# Patient Record
Sex: Female | Born: 1990 | Hispanic: Yes | Marital: Married | Smoking: Never smoker
Health system: Southern US, Community
[De-identification: ages and names within clinical notes are randomized; demographics above are authoritative.]

## PROBLEM LIST (undated history)

## (undated) DIAGNOSIS — O24419 Gestational diabetes mellitus in pregnancy, unspecified control: Secondary | ICD-10-CM

## (undated) DIAGNOSIS — B009 Herpesviral infection, unspecified: Secondary | ICD-10-CM

## (undated) DIAGNOSIS — I8390 Asymptomatic varicose veins of unspecified lower extremity: Secondary | ICD-10-CM

## (undated) DIAGNOSIS — Z6710 Type A blood, Rh positive: Secondary | ICD-10-CM

## (undated) DIAGNOSIS — K0889 Other specified disorders of teeth and supporting structures: Secondary | ICD-10-CM

## (undated) DIAGNOSIS — D649 Anemia, unspecified: Secondary | ICD-10-CM

## (undated) DIAGNOSIS — R12 Heartburn: Secondary | ICD-10-CM

## (undated) HISTORY — DX: Gestational diabetes mellitus in pregnancy, unspecified control: O24.419

## (undated) HISTORY — DX: Anemia, unspecified: D64.9

## (undated) HISTORY — PX: NO PAST SURGERIES: SHX2092

## (undated) HISTORY — PX: OTHER SURGICAL HISTORY: SHX169

## (undated) HISTORY — DX: Heartburn: R12

## (undated) HISTORY — DX: Asymptomatic varicose veins of unspecified lower extremity: I83.90

## (undated) HISTORY — DX: Other specified disorders of teeth and supporting structures: K08.89

---
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
## 2012-09-11 LAB — OB RESULTS CONSOLE VARICELLA ZOSTER ANTIBODY, IGG: Varicella: IMMUNE

## 2012-09-11 LAB — OB RESULTS CONSOLE RUBELLA ANTIBODY, IGM: Rubella: IMMUNE

## 2012-11-27 ENCOUNTER — Ambulatory Visit: Payer: Self-pay | Admitting: Family Medicine

## 2013-02-19 ENCOUNTER — Ambulatory Visit: Payer: Self-pay | Admitting: Advanced Practice Midwife

## 2013-02-25 ENCOUNTER — Ambulatory Visit: Payer: Self-pay | Admitting: Advanced Practice Midwife

## 2013-03-20 ENCOUNTER — Ambulatory Visit: Payer: Self-pay | Admitting: Advanced Practice Midwife

## 2013-03-28 ENCOUNTER — Ambulatory Visit: Payer: Self-pay | Admitting: Advanced Practice Midwife

## 2013-04-19 ENCOUNTER — Inpatient Hospital Stay: Payer: Self-pay | Admitting: Obstetrics and Gynecology

## 2013-04-19 LAB — CBC WITH DIFFERENTIAL/PLATELET
Basophil #: 0.1 10*3/uL (ref 0.0–0.1)
Basophil %: 0.5 %
Eosinophil %: 0.3 %
HGB: 13.9 g/dL (ref 12.0–16.0)
Lymphocyte #: 2.3 10*3/uL (ref 1.0–3.6)
MCH: 27.3 pg (ref 26.0–34.0)
MCHC: 34.4 g/dL (ref 32.0–36.0)
MCV: 80 fL (ref 80–100)
Monocyte #: 0.6 x10 3/mm (ref 0.2–0.9)
Neutrophil #: 8.8 10*3/uL — ABNORMAL HIGH (ref 1.4–6.5)
Neutrophil %: 74 %
Platelet: 253 10*3/uL (ref 150–440)
RDW: 18.3 % — ABNORMAL HIGH (ref 11.5–14.5)
WBC: 11.9 10*3/uL — ABNORMAL HIGH (ref 3.6–11.0)

## 2013-04-19 LAB — GC/CHLAMYDIA PROBE AMP

## 2013-04-21 LAB — HEMOGLOBIN: HGB: 9.4 g/dL — ABNORMAL LOW (ref 12.0–16.0)

## 2014-08-28 NOTE — L&D Delivery Note (Signed)
Delivery Note At 8:31 PM a viable female sex was delivered via Vaginal, Spontaneous Delivery (Presentation: ; Occiput Anterior).  APGAR: 8,9 ; weight 3265g .   Placenta status: Intact, Spontaneous.  Cord:  with the following complications: .  Cord pH: no needed  Anesthesia: None  Episiotomy:   Lacerations: 1st degree Suture Repair: 2.0 vicryl Est. Blood Loss (mL):  700 cc. Uterine atony noted immediately after placenta removed despite active third stage management. Pitocin started, 0.2 mg of methergine given IM, vigorous uterine massage led to adequate tone. 800 mcg of misoprostol given PR for ppx.   Mom to postpartum.  Baby to Couplet care / Skin to Skin.  GOODMAN, DAVID MICHAEL 02/27/2015, 8:50 PM

## 2014-09-01 ENCOUNTER — Ambulatory Visit: Payer: Self-pay | Admitting: Family Medicine

## 2014-09-25 ENCOUNTER — Ambulatory Visit: Payer: Self-pay | Admitting: Advanced Practice Midwife

## 2014-12-08 ENCOUNTER — Ambulatory Visit
Admit: 2014-12-08 | Disposition: A | Discharge status: Home / Self Care | Payer: Self-pay | Attending: Family Medicine | Admitting: Family Medicine

## 2014-12-18 NOTE — Discharge Summary (Signed)
Dates of Admission and Diagnosis:  Date of Admission 19-Apr-2013   Date of Discharge 22-Apr-2013   Admitting Diagnosis labor   Final Diagnosis delivery    Chief Complaint/History of Present Illness labor at term   Hospital Course:  Hospital Course SVD without trouble   Condition on Discharge Critical   DISCHARGE INSTRUCTIONS HOME MEDS:  Medication Reconciliation: Patient's Home Medications at Discharge:     Electronic Signatures: Margaretha GlassingEvans, [name] L (MD)  (Signed 26-Aug-14 09:37)  Authored: ADMISSION DATE AND DIAGNOSIS, CHIEF COMPLAINT/HPI, HOSPITAL COURSE, DISCHARGE INSTRUCTIONS HOME MEDS   Last Updated: 26-Aug-14 09:37 by Margaretha GlassingEvans, [name] L (MD)

## 2015-01-05 NOTE — H&P (Signed)
L&D Evaluation:  History:  [facility] 24 [facility]   Presents with contractions   Patient's Medical History No Chronic Illness  +GDM   Patient's Surgical History none   Medications Pre Natal Vitamins   Allergies NKDA   ROS:  ROS All systems were reviewed.  HEENT, CNS, GI, GU, Respiratory, CV, Renal and Musculoskeletal systems were found to be normal.   Exam:  Vital Signs stable   General no apparent distress   Abdomen gravid, tender with contractions   Estimated Fetal Weight Average for gestational age   Edema 1+   Pelvic 1->4 cm   Mebranes SROM 04:30 8/24...clear   FHT normal rate with no decels   Ucx regular   Ucx Frequency 3 min   Impression:  Impression early labor   Plan:  Plan monitor contractions and for cervical change   Comments Blood sugar wnl IV stadol for pain mgt per pt request   Electronic Signatures: Margaretha GlassingEvans, [name] L (MD)  (Signed 24-Aug-14 04:27)  Authored: L&D Evaluation   Last Updated: 24-Aug-14 04:27 by Margaretha GlassingEvans, [name] L (MD)

## 2015-02-27 ENCOUNTER — Encounter: Payer: Self-pay | Admitting: *Deleted

## 2015-02-27 ENCOUNTER — Inpatient Hospital Stay
Admission: EM | Admit: 2015-02-27 | Discharge: 2015-03-01 | DRG: 775 | Disposition: A | Discharge status: Home / Self Care | Payer: Medicaid Other | Attending: Obstetrics and Gynecology | Admitting: Obstetrics and Gynecology

## 2015-02-27 DIAGNOSIS — O24420 Gestational diabetes mellitus in childbirth, diet controlled: Secondary | ICD-10-CM | POA: Diagnosis present

## 2015-02-27 DIAGNOSIS — Z3A39 39 weeks gestation of pregnancy: Secondary | ICD-10-CM | POA: Diagnosis present

## 2015-02-27 HISTORY — DX: Herpesviral infection, unspecified: B00.9

## 2015-02-27 LAB — CBC
HEMATOCRIT: 40.7 % (ref 35.0–47.0)
HEMOGLOBIN: 13.1 g/dL (ref 12.0–16.0)
MCH: 24.6 pg — ABNORMAL LOW (ref 26.0–34.0)
MCHC: 32.1 g/dL (ref 32.0–36.0)
MCV: 76.6 fL — AB (ref 80.0–100.0)
PLATELETS: 256 10*3/uL (ref 150–440)
RBC: 5.32 MIL/uL — ABNORMAL HIGH (ref 3.80–5.20)
RDW: 16.1 % — ABNORMAL HIGH (ref 11.5–14.5)
WBC: 12.4 10*3/uL — ABNORMAL HIGH (ref 3.6–11.0)

## 2015-02-27 LAB — TYPE AND SCREEN
ABO/RH(D): A POS
Antibody Screen: NEGATIVE

## 2015-02-27 LAB — ABO/RH: ABO/RH(D): A POS

## 2015-02-27 MED ORDER — LACTATED RINGERS IV SOLN
INTRAVENOUS | Status: DC
  Administered 2015-02-27: 19:00:00 via INTRAVENOUS

## 2015-02-27 MED ORDER — MISOPROSTOL 200 MCG PO TABS
ORAL_TABLET | ORAL | Status: AC
  Filled 2015-02-27: qty 4

## 2015-02-27 MED ORDER — OXYTOCIN 40 UNITS IN LACTATED RINGERS INFUSION - SIMPLE MED
62.5000 mL/h | INTRAVENOUS | Status: DC
Start: 2015-02-27 — End: 2015-02-28

## 2015-02-27 MED ORDER — MORPHINE SULFATE 4 MG/ML IJ SOLN
INTRAMUSCULAR | Status: AC
  Administered 2015-02-27: 21:00:00
  Filled 2015-02-27: qty 1

## 2015-02-27 MED ORDER — IBUPROFEN 600 MG PO TABS
ORAL_TABLET | ORAL | Status: AC
  Administered 2015-02-27: 600 mg via ORAL
  Filled 2015-02-27: qty 1

## 2015-02-27 MED ORDER — MISOPROSTOL 200 MCG PO TABS
ORAL_TABLET | ORAL | Status: AC
  Administered 2015-02-27: 200 ug
  Filled 2015-02-27: qty 1

## 2015-02-27 MED ORDER — LIDOCAINE HCL (PF) 1 % IJ SOLN
INTRAMUSCULAR | Status: AC
  Filled 2015-02-27: qty 30

## 2015-02-27 MED ORDER — METHYLERGONOVINE MALEATE 0.2 MG/ML IJ SOLN
INTRAMUSCULAR | Status: AC
  Administered 2015-02-27: 21:00:00 via INTRAMUSCULAR
  Filled 2015-02-27: qty 1

## 2015-02-27 MED ORDER — LACTATED RINGERS IV SOLN: 500.0000 mL | INTRAVENOUS | Status: DC | PRN

## 2015-02-27 MED ORDER — HYDROCODONE-ACETAMINOPHEN 5-325 MG PO TABS
ORAL_TABLET | ORAL | Status: AC
  Filled 2015-02-27: qty 2

## 2015-02-27 MED ORDER — CITRIC ACID-SODIUM CITRATE 334-500 MG/5ML PO SOLN: 30.0000 mL | ORAL | Status: DC | PRN

## 2015-02-27 MED ORDER — OXYTOCIN BOLUS FROM INFUSION
500.0000 mL | INTRAVENOUS | Status: DC
  Administered 2015-02-27: 500 mL via INTRAVENOUS

## 2015-02-27 MED ORDER — LIDOCAINE HCL (PF) 1 % IJ SOLN
30.0000 mL | INTRAMUSCULAR | Status: DC | PRN
  Filled 2015-02-27: qty 30

## 2015-02-27 MED ORDER — OXYTOCIN 40 UNITS IN LACTATED RINGERS INFUSION - SIMPLE MED
INTRAVENOUS | Status: AC
  Filled 2015-02-27: qty 1000

## 2015-02-27 MED ORDER — OXYCODONE-ACETAMINOPHEN 5-325 MG PO TABS
2.0000 | ORAL_TABLET | ORAL | Status: DC | PRN
  Administered 2015-02-28: 2 via ORAL
  Filled 2015-02-27: qty 2

## 2015-02-27 MED ORDER — IBUPROFEN 600 MG PO TABS
600.0000 mg | ORAL_TABLET | Freq: Four times a day (QID) | ORAL | Status: DC
  Administered 2015-02-27 – 2015-03-01 (×5): 600 mg via ORAL
  Filled 2015-02-27 (×4): qty 1

## 2015-02-27 MED ORDER — ONDANSETRON HCL 4 MG/2ML IJ SOLN: 4.0000 mg | Freq: Four times a day (QID) | INTRAMUSCULAR | Status: DC | PRN

## 2015-02-27 NOTE — H&P (Signed)
HISTORY AND PHYSICAL  HISTORY OF PRESENT ILLNESS: Ms. Kristine Blake is a 24 y.o. G2P1001 at [redacted]w[redacted]d by LMP consistent with 18 week ultrasound with a pregnancy complicated by A1GDM presenting for evaluation of labor.   She has  been having contractions Q5-10 since 1200 and denies leakage of fluid, vaginal bleeding, or decreased fetal movement.   She declined taking glyburide this pregnancy and reports adequate diet control this pregnancy with FSBS <90 in AM.   REVIEW OF SYSTEMS: A complete review of systems was performed and was specifically negative for headache, changes in vision, RUQ pain, shortness of breath, chest pain, lower extremity edema and dysuria.   HISTORY:  Past Medical History  Diagnosis Date  . HSV infection     History reviewed. No pertinent past surgical history.  No current facility-administered medications on file prior to encounter.   No current outpatient prescriptions on file prior to encounter.     Not on File  OB History  Gravida Para Term Preterm AB SAB TAB Ectopic Multiple Living  2 1 1       1     # Outcome Date GA Lbr Len/2nd Weight Sex Delivery Anes PTL Lv  2 Current           1 Term 04/20/13     Vag-Spont   Y      History  Substance Use Topics  . Smoking status: Never Smoker   . Smokeless tobacco: Never Used  . Alcohol Use: No    PHYSICAL EXAM: Pulse Rate:  [92-118] 92 (07/02 1943) Resp:  [22] 22 (07/02 1857) BP: (110-133)/(85-98) 110/85 mmHg (07/02 1943) Weight:  [79.833 kg (176 lb)] 79.833 kg (176 lb) (07/02 1855)  GENERAL: NAD AAOx3 CHEST:CTAB no increased work of breathing CV:RRR no appreciable murmurs, rubs, gallops ABDOMEN: gravid, nontender, EFW 3600g by [place] EXTREMITIES:  Warm and well-perfused, nontender, nonedematous, 2+ DTRs, no clonus CERVIX: 8/ 90/ 0 SPECULUM: no evidence of HSV lesions  FHT:140s baseline with mod variability + accelerations and 0 decelerations  Toco: 2-3  DIAGNOSTIC STUDIES:  Recent Labs Lab  02/27/15 1918  WBC 12.4*  HGB 13.1  HCT 40.7  PLT 256    PRENATAL STUDIES:  Prenatal Labs:  MBT: A+; Rubella immune, Varicella immune, HIV neg, RPR neg, Hep B neg, GC/CT neg, GBS neg, glucola 147 3 hr 74/151/158/150   ASSESSMENT AND PLAN:  1. Fetal Well being  - Fetal Tracing: reassuring - Group B Streptococcus: neg - Presentation: vertex confirmed by myself   2. Routine OB: - Prenatal labs reviewed, as above - Rh +  3. Evaluation of Labor:  -  Contractions present with external toco in place -  Pelvis proven to 3315 g. AROMed clear fluid, mom and baby tolerated well.  -  Plan for management with serial vaginal exams, AROM when appropriate, augmentation as necessary.   4. Post Partum Planning: - Infant feeding: breastfeeding - Contraception: not addressed.

## 2015-02-28 LAB — CBC
HCT: 34.2 % — ABNORMAL LOW (ref 35.0–47.0)
Hemoglobin: 10.9 g/dL — ABNORMAL LOW (ref 12.0–16.0)
MCH: 24.3 pg — AB (ref 26.0–34.0)
MCHC: 31.8 g/dL — AB (ref 32.0–36.0)
MCV: 76.3 fL — ABNORMAL LOW (ref 80.0–100.0)
Platelets: 195 10*3/uL (ref 150–440)
RBC: 4.49 MIL/uL (ref 3.80–5.20)
RDW: 15.8 % — AB (ref 11.5–14.5)
WBC: 18.3 10*3/uL — ABNORMAL HIGH (ref 3.6–11.0)

## 2015-02-28 MED ORDER — WITCH HAZEL-GLYCERIN EX PADS: 1.0000 "application " | MEDICATED_PAD | CUTANEOUS | Status: DC | PRN

## 2015-02-28 MED ORDER — DIPHENHYDRAMINE HCL 25 MG PO CAPS: 25.0000 mg | ORAL_CAPSULE | Freq: Four times a day (QID) | ORAL | Status: DC | PRN

## 2015-02-28 MED ORDER — ZOLPIDEM TARTRATE 5 MG PO TABS: 5.0000 mg | ORAL_TABLET | Freq: Every evening | ORAL | Status: DC | PRN

## 2015-02-28 MED ORDER — OXYCODONE-ACETAMINOPHEN 5-325 MG PO TABS: 1.0000 | ORAL_TABLET | ORAL | Status: DC | PRN

## 2015-02-28 MED ORDER — BENZOCAINE-MENTHOL 20-0.5 % EX AERO: 1.0000 "application " | INHALATION_SPRAY | CUTANEOUS | Status: DC | PRN

## 2015-02-28 MED ORDER — DIBUCAINE 1 % RE OINT: 1.0000 "application " | TOPICAL_OINTMENT | RECTAL | Status: DC | PRN

## 2015-02-28 MED ORDER — TETANUS-DIPHTH-ACELL PERTUSSIS 5-2.5-18.5 LF-MCG/0.5 IM SUSP: 0.5000 mL | Freq: Once | INTRAMUSCULAR | Status: DC

## 2015-02-28 MED ORDER — OXYTOCIN 40 UNITS IN LACTATED RINGERS INFUSION - SIMPLE MED: 125.0000 mL/h | INTRAVENOUS | Status: DC | PRN

## 2015-02-28 MED ORDER — SIMETHICONE 80 MG PO CHEW: 80.0000 mg | CHEWABLE_TABLET | ORAL | Status: DC | PRN

## 2015-02-28 MED ORDER — LANOLIN HYDROUS EX OINT: TOPICAL_OINTMENT | CUTANEOUS | Status: DC | PRN

## 2015-02-28 MED ORDER — ACETAMINOPHEN 325 MG PO TABS: 650.0000 mg | ORAL_TABLET | ORAL | Status: DC | PRN

## 2015-02-28 MED ORDER — SENNOSIDES-DOCUSATE SODIUM 8.6-50 MG PO TABS
2.0000 | ORAL_TABLET | ORAL | Status: DC
  Administered 2015-03-01: 2 via ORAL
  Filled 2015-02-28: qty 2

## 2015-02-28 MED ORDER — ONDANSETRON HCL 4 MG/2ML IJ SOLN: 4.0000 mg | INTRAMUSCULAR | Status: DC | PRN

## 2015-02-28 MED ORDER — ONDANSETRON HCL 4 MG PO TABS: 4.0000 mg | ORAL_TABLET | ORAL | Status: DC | PRN

## 2015-02-28 MED ORDER — PRENATAL MULTIVITAMIN CH
1.0000 | ORAL_TABLET | Freq: Every day | ORAL | Status: DC
  Administered 2015-02-28: 1 via ORAL
  Filled 2015-02-28: qty 1

## 2015-02-28 NOTE — Progress Notes (Signed)
Post Partum Day 1 Subjective: no complaints  Objective: Blood pressure 103/61, pulse 80, temperature 97.7 F (36.5 C), temperature source Oral, resp. rate 18, height 5\' 2"  (1.575 m), weight 79.833 kg (176 lb), SpO2 98 %, unknown if currently breastfeeding.  Physical Exam:  General: alert Lochia: appropriate Uterine Fundus: firm Incision: healing well DVT Evaluation: No evidence of DVT seen on physical exam.   Recent Labs  02/27/15 1918 02/28/15 0425  HGB 13.1 10.9*  HCT 40.7 34.2*    Assessment/Plan: Plan for discharge tomorrow   LOS: 1 day   Sharee PimpleJONES, CARON W 02/28/2015, 10:08 AM

## 2015-03-01 LAB — RPR: RPR Ser Ql: NONREACTIVE

## 2015-03-01 MED ORDER — IBUPROFEN 600 MG PO TABS: 600.0000 mg | ORAL_TABLET | Freq: Four times a day (QID) | ORAL | Status: DC

## 2015-03-01 NOTE — Progress Notes (Signed)
Patient understands all discharge instructions and the need to make follow up appointments. Patient discharge via wheelchair with auxillary. 

## 2015-03-01 NOTE — Progress Notes (Deleted)
Bleeding: Your bleeding could continue up to 6 weeks, the flow should gradually decrease and the color should become dark then lightened over the next couple of weeks. If you notice you are bleeding heavily or passing clots larger than the size of your fist, PLEASE call your physician. No TAMPONS, DOUCHING, ENEMAS OR SEXUAL INTERCOURSE for 6 weeks.  ° °Stitches: Shower daily with mild soap and water. Stitches will dissolve over the next couple of weeks, if you experience any discomfort in the vaginal area you may sit in warm water 15-20 minutes, 3-4 times per day. Just enough water to cover vaginal area.  ° °AfterPains: This is the uterus contracting back to its normal position and size. Use medications prescribed or recommended by your physician to help relieve this discomfort.  ° °Bowels/Hemorrhoids: Drink plenty of water and stay active. Increase fiber, fresh fruits and vegetables in your diet.  ° °Rest/Activity: Rest when the baby is resting ° °Bathing: Shower daily! ° °Diet: Continue to eat extra calories until your follow up visit to help replenish nutrients and vitamins. If breastfeeding eat an extra 500-1000 and increase your fluid intake to 12 glasses a day.  ° °Contraception: Consult with your physician on what method of birth control you would like to use.  ° °Postpartum "BLUES": It is common to emotional days after delivery, however if it persist for greater than 2 weeks or if you feel concerned please let your physician know immediately. This is hormone driven and nothing you can control so please let someone know how you feel. ° °Follow Up Visit: Please schedule a follow up visit with your physician.  °

## 2015-03-01 NOTE — Discharge Summary (Signed)
Obstetric Discharge Summary Reason for Admission: onset of labor Prenatal Procedures: ultrasound Intrapartum Procedures: spontaneous vaginal delivery Postpartum Procedures: none Complications-Operative and Postpartum: none HEMOGLOBIN  Date Value Ref Range Status  02/28/2015 10.9* 12.0 - 16.0 g/dL Final   HGB  Date Value Ref Range Status  04/21/2013 9.4* 12.0-16.0 g/dL Final   HCT  Date Value Ref Range Status  02/28/2015 34.2* 35.0 - 47.0 % Final  04/19/2013 40.4 35.0-47.0 % Final    Physical Exam:  General: alert Lochia: appropriate Uterine Fundus: firm Incision: healing well DVT Evaluation: No evidence of DVT seen on physical exam.  Discharge Diagnoses: Term Pregnancy-delivered  Discharge Information: Date: 03/01/2015 Activity: unrestricted Diet: routine Medications: Iron Condition: stable Instructions: refer to practice specific booklet and fu at 6 weeks Discharge to: home   Newborn Data: Live born female  Birth Weight: 7 lb 3.2 oz (3266 g) APGAR: 8, 9  Home with mother.  Milon ScoreJONES, CARON W 03/01/2015, 8:22 AM

## 2015-06-23 ENCOUNTER — Emergency Department
Admission: EM | Admit: 2015-06-23 | Discharge: 2015-06-23 | Disposition: A | Discharge status: Home / Self Care | Payer: Self-pay | Attending: Emergency Medicine | Admitting: Emergency Medicine

## 2015-06-23 ENCOUNTER — Encounter: Payer: Self-pay | Admitting: Emergency Medicine

## 2015-06-23 DIAGNOSIS — J029 Acute pharyngitis, unspecified: Secondary | ICD-10-CM | POA: Insufficient documentation

## 2015-06-23 LAB — POCT RAPID STREP A: Streptococcus, Group A Screen (Direct): NEGATIVE

## 2015-06-23 MED ORDER — LIDOCAINE VISCOUS 2 % MT SOLN
15.0000 mL | Freq: Once | OROMUCOSAL | Status: AC
  Administered 2015-06-23: 15 mL via OROMUCOSAL
  Filled 2015-06-23: qty 15

## 2015-06-23 MED ORDER — AMOXICILLIN-POT CLAVULANATE 875-125 MG PO TABS
1.0000 | ORAL_TABLET | Freq: Once | ORAL | Status: AC
  Administered 2015-06-23: 1 via ORAL
  Filled 2015-06-23: qty 1

## 2015-06-23 MED ORDER — AMOXICILLIN-POT CLAVULANATE 875-125 MG PO TABS: 1.0000 | ORAL_TABLET | Freq: Two times a day (BID) | ORAL | Status: AC

## 2015-06-23 NOTE — Discharge Instructions (Signed)
Faringitis  (Pharyngitis)  La faringitis ocurre cuando la faringe presenta enrojecimiento, dolor e hinchazón (inflamación).   CAUSAS   Normalmente, la faringitis se debe a una infección. Generalmente, estas infecciones ocurren debido a virus (viral) y se presentan cuando las personas se resfrían. Sin embargo, a veces la faringitis es provocada por bacterias (bacteriana). Las alergias también pueden ser una causa de la faringitis. La faringitis viral se puede contagiar de una persona a otra al toser, estornudar y compartir objetos o utensilios personales (tazas, tenedores, cucharas, cepillos de diente). La faringitis bacteriana se puede contagiar de una persona a otra a través de un contacto más íntimo, como besar.   SIGNOS Y SÍNTOMAS   Los síntomas de la faringitis incluyen los siguientes:   · Dolor de garganta.  · Cansancio (fatiga).  · Fiebre no muy elevada.  · Dolor de cabeza.  · Dolores musculares y en las articulaciones.  · Erupciones cutáneas  · Ganglios linfáticos hinchados.  · Una película parecida a las placas en la garganta o las amígdalas (frecuente con la faringitis bacteriana).  DIAGNÓSTICO   El médico le hará preguntas sobre la enfermedad y sus síntomas. Normalmente, todo lo que se necesita para diagnosticar una faringitis son sus antecedentes médicos y un examen físico. A veces se realiza una prueba rápida para estreptococos. También es posible que se realicen otros análisis de laboratorio, según la posible causa.   TRATAMIENTO   La faringitis viral normalmente mejorará en un plazo de 3 a 4 días sin medicamentos. La faringitis bacteriana se trata con medicamentos que matan los gérmenes (antibióticos).   INSTRUCCIONES PARA EL CUIDADO EN EL HOGAR   · Beba gran cantidad de líquido para mantener la orina de tono claro o color amarillo pálido.  · Tome solo medicamentos de venta libre o recetados, según las indicaciones del médico.    Si le receta antibióticos, asegúrese de terminarlos, incluso si comienza  a sentirse mejor.    No tome aspirina.  · Descanse lo suficiente.  · Hágase gárgaras con 8 onzas (227 ml) de agua con sal (½ cucharadita de sal por litro de agua) cada 1 o 2 horas para calmar la garganta.  · Puede usar pastillas (si no corre riesgo de ahogarse) o aerosoles para calmar la garganta.  SOLICITE ATENCIÓN MÉDICA SI:   · Tiene bultos grandes y dolorosos en el cuello.  · Tiene una erupción cutánea.  · Cuando tose elimina una expectoración verde, amarillo amarronado o con sangre.  SOLICITE ATENCIÓN MÉDICA DE INMEDIATO SI:   · El cuello se pone rígido.  · Comienza a babear o no puede tragar líquidos.  · Vomita o no puede retener los medicamentos ni los líquidos.  · Siente un dolor intenso que no se alivia con los medicamentos recomendados.  · Tiene dificultades para respirar (y no debido a la nariz tapada).  ASEGÚRESE DE QUE:   · Comprende estas instrucciones.  · Controlará su afección.  · Recibirá ayuda de inmediato si no mejora o si empeora.     Esta información no tiene como fin reemplazar el consejo del médico. Asegúrese de hacerle al médico cualquier pregunta que tenga.     Document Released: 05/24/2005 Document Revised: 06/04/2013  Elsevier Interactive Patient Education ©2016 [employer]

## 2015-06-23 NOTE — ED Notes (Addendum)
Patient ambulatory to triage with steady gait, without difficulty or distress noted; [facility], pt reports sore throat since last night; pt denies any fever or accomp symptoms; st "I feel like I have swords in my stomach"

## 2015-06-23 NOTE — ED Provider Notes (Signed)
[facility] Emergency Department Provider Note  ____________________________________________  Time seen: 4:30 AM  I have reviewed the triage vital signs and the nursing notes.   HISTORY  Chief Complaint Sore Throat     HPI Kristine Blake is a 24 y.o. female presents with sore throat times one day. Patient states feels like him swallowing swords". Patient denies any fever at home no nausea or vomiting or cough. Patient denies any history of pharyngitis.     Past Medical History  Diagnosis Date  . HSV infection     Patient Active Problem List   Diagnosis Date Noted  . Labor and delivery, indication for care 02/27/2015    History reviewed. No pertinent past surgical history.  No current outpatient prescriptions on file.  Allergies Review of patient's allergies indicates no known allergies.  No family history on file.  Social History Social History  Substance Use Topics  . Smoking status: Never Smoker   . Smokeless tobacco: Never Used  . Alcohol Use: No    Review of Systems  Constitutional: Negative for fever. Eyes: Negative for visual changes. ENT: Positive for sore throat. Cardiovascular: Negative for chest pain. Respiratory: Negative for shortness of breath. Gastrointestinal: Negative for abdominal pain, vomiting and diarrhea. Genitourinary: Negative for dysuria. Musculoskeletal: Negative for back pain. Skin: Negative for rash. Neurological: Negative for headaches, focal weakness or numbness.   10-point ROS otherwise negative.  ____________________________________________   PHYSICAL EXAM:  VITAL SIGNS: ED Triage Vitals  Enc Vitals Group     BP 06/23/15 0318 122/94 mmHg     Pulse Rate 06/23/15 0318 87     Resp 06/23/15 0318 20     Temp 06/23/15 0318 97.9 F (36.6 C)     Temp Source 06/23/15 0318 Oral     SpO2 06/23/15 0318 100 %     Weight --      Height --      Head Cir --      Peak Flow --      Pain  Score 06/23/15 0327 8     Pain Loc --      Pain Edu? --      Excl. in GC? --     Constitutional: Alert and oriented. Well appearing and in no distress. Eyes: Conjunctivae are normal. PERRL. Normal extraocular movements. ENT   Head: Normocephalic and atraumatic.   Nose: No congestion/rhinnorhea.   Mouth/Throat: Mucous membranes are moist. Positive pharyngeal erythema with scant exudate   Neck: No stridor Hematological/Lymphatic/Immunilogical: Positive anterior cervical lymphadenopathy. Cardiovascular: Normal rate, regular rhythm. Normal and symmetric distal pulses are present in all extremities. No murmurs, rubs, or gallops. Respiratory: Normal respiratory effort without tachypnea nor retractions. Breath sounds are clear and equal bilaterally. No wheezes/rales/rhonchi. Gastrointestinal: Soft and nontender. No distention. There is no CVA tenderness. Genitourinary: deferred Musculoskeletal: Nontender with normal range of motion in all extremities. No joint effusions.  No lower extremity tenderness nor edema. Neurologic:  Normal speech and language. No gross focal neurologic deficits are appreciated. Speech is normal.  Skin:  Skin is warm, dry and intact. No rash noted. Psychiatric: Mood and affect are normal. Speech and behavior are normal. Patient exhibits appropriate insight and judgment.  _   INITIAL IMPRESSION / ASSESSMENT AND PLAN / ED COURSE  Pertinent labs & imaging results that were available during my care of the patient were reviewed by me and considered in my medical decision making (see chart for details).  History and physical exam consistent  with pharyngitis.  ____________________________________________   FINAL CLINICAL IMPRESSION(S) / ED DIAGNOSES  Final diagnoses:  Acute pharyngitis, unspecified etiology      Darci Currentandolph N Kinjal Neitzke, MD 06/23/15 [phone]

## 2015-06-25 LAB — CULTURE, GROUP A STREP (THRC)

## 2016-08-28 NOTE — L&D Delivery Note (Signed)
Delivery Note At 11:14 AM a viable and healthy female "Diego" was delivered via Vaginal, Spontaneous (Presentation: ROP ).  APGAR: 8, 9; weight 8 lb 2.9 oz (3710 g).   Placenta status: intact, spontaneous.  Cord: 3VC  with the following complications: trailing  Anesthesia:  none Episiotomy: None Lacerations:  Periclitoral, hemostatic Suture Repair: none Est. Blood Loss (mL):  1500  Mom to postpartum.  Baby to Couplet care / Skin to Skin.  16XW [id number] G4P2012 at 38+5wks presented in active labor at 9cm, with a bulging bag. Declined epidural, accepted nitrous. AROM for meconium fluid. Pushed over intact perineum for an OP baby, no shoulder, no nuchal cord. Baby placed on maternal abdomen and active and crying. Delayed cord clamping and FOB cut the cord. Small clitoral tear without bleeding noted.  Her placenta delivered with expression intact but with trailing membranes, and she began to bleed briskly and promptly. She received a bolus of 40u iv pitocin in the 3rd stage, and bimanual massage only helped minimally. Fundus entirely floppy. IV dilaudid given, staff assist code called, and 0.2mg  IM methergine given. 800mcg rectal cytotec given and bimanual extraction of clots. Bleeding continued, and im hemabate given 250mcg. Bleeding slowed. Fundus firm and bleeding minimal at conclusion of case. Evaluation of cervix and vagina and no lacerations noted.   Total quant blood loss: 1650ml  Spanish interpreter present throughout. All questions for mom and FOB answered.  Christeen DouglasBethany Abdalrahman Clementson 08/20/2017, 12:15 PM

## 2016-10-20 ENCOUNTER — Emergency Department
Admission: EM | Admit: 2016-10-20 | Discharge: 2016-10-20 | Disposition: A | Discharge status: Home / Self Care | Payer: Self-pay | Attending: Emergency Medicine | Admitting: Emergency Medicine

## 2016-10-20 ENCOUNTER — Encounter: Payer: Self-pay | Admitting: Emergency Medicine

## 2016-10-20 ENCOUNTER — Emergency Department: Payer: Self-pay

## 2016-10-20 DIAGNOSIS — R109 Unspecified abdominal pain: Secondary | ICD-10-CM

## 2016-10-20 DIAGNOSIS — O039 Complete or unspecified spontaneous abortion without complication: Secondary | ICD-10-CM | POA: Insufficient documentation

## 2016-10-20 DIAGNOSIS — R102 Pelvic and perineal pain: Secondary | ICD-10-CM | POA: Insufficient documentation

## 2016-10-20 DIAGNOSIS — N939 Abnormal uterine and vaginal bleeding, unspecified: Secondary | ICD-10-CM

## 2016-10-20 LAB — BASIC METABOLIC PANEL
Anion gap: 6 (ref 5–15)
BUN: 9 mg/dL (ref 6–20)
CALCIUM: 9 mg/dL (ref 8.9–10.3)
CO2: 25 mmol/L (ref 22–32)
CREATININE: 0.53 mg/dL (ref 0.44–1.00)
Chloride: 108 mmol/L (ref 101–111)
GFR calc Af Amer: >60 mL/min (ref >60)
GLUCOSE: 96 mg/dL (ref 65–99)
Potassium: 3.8 mmol/L (ref 3.5–5.1)
Sodium: 139 mmol/L (ref 135–145)

## 2016-10-20 LAB — URINALYSIS, COMPLETE (UACMP) WITH MICROSCOPIC
Bacteria, UA: NONE SEEN
Bilirubin Urine: NEGATIVE
GLUCOSE, UA: NEGATIVE mg/dL
HGB URINE DIPSTICK: NEGATIVE
Ketones, ur: NEGATIVE mg/dL
Leukocytes, UA: NEGATIVE
Nitrite: NEGATIVE
Protein, ur: NEGATIVE mg/dL
RBC / HPF: NONE SEEN RBC/hpf (ref 0–5)
Specific Gravity, Urine: 1.002 — ABNORMAL LOW (ref 1.005–1.030)
Squamous Epithelial / LPF: NONE SEEN
WBC, UA: NONE SEEN WBC/hpf (ref 0–5)
pH: 6 (ref 5.0–8.0)

## 2016-10-20 LAB — CBC WITH DIFFERENTIAL/PLATELET
Basophils Absolute: 0 10*3/uL (ref 0–0.1)
Basophils Relative: 0 %
Eosinophils Absolute: 0.2 10*3/uL (ref 0–0.7)
Eosinophils Relative: 3 %
HCT: 38.9 % (ref 35.0–47.0)
Hemoglobin: 13.5 g/dL (ref 12.0–16.0)
LYMPHS ABS: 3.7 10*3/uL — AB (ref 1.0–3.6)
LYMPHS PCT: 39 %
MCH: 28.7 pg (ref 26.0–34.0)
MCHC: 34.6 g/dL (ref 32.0–36.0)
MCV: 82.9 fL (ref 80.0–100.0)
MONO ABS: 0.7 10*3/uL (ref 0.2–0.9)
MONOS PCT: 7 %
Neutro Abs: 5 10*3/uL (ref 1.4–6.5)
Neutrophils Relative %: 51 %
PLATELETS: 248 10*3/uL (ref 150–440)
RBC: 4.69 MIL/uL (ref 3.80–5.20)
RDW: 13.6 % (ref 11.5–14.5)
WBC: 9.7 10*3/uL (ref 3.6–11.0)

## 2016-10-20 LAB — HCG, QUANTITATIVE, PREGNANCY: hCG, Beta Chain, Quant, S: 235 m[IU]/mL — ABNORMAL HIGH (ref <5)

## 2016-10-20 LAB — ABO/RH: ABO/RH(D): A POS

## 2016-10-20 LAB — POCT PREGNANCY, URINE
Preg Test, Ur: NEGATIVE
Preg Test, Ur: POSITIVE — AB

## 2016-10-20 MED ORDER — METHYLERGONOVINE MALEATE 0.2 MG PO TABS
0.2000 mg | ORAL_TABLET | Freq: Once | ORAL | Status: AC
  Administered 2016-10-20: 0.2 mg via ORAL
  Filled 2016-10-20: qty 1

## 2016-10-20 MED ORDER — IBUPROFEN 600 MG PO TABS: 600.0000 mg | ORAL_TABLET | Freq: Four times a day (QID) | ORAL | 0 refills | Status: DC | PRN

## 2016-10-20 MED ORDER — ACETAMINOPHEN 325 MG PO TABS
650.0000 mg | ORAL_TABLET | Freq: Once | ORAL | Status: AC
  Administered 2016-10-20: 650 mg via ORAL
  Filled 2016-10-20: qty 2

## 2016-10-20 NOTE — ED Provider Notes (Addendum)
[facility] Emergency Department Provider Note  ____________________________________________   I have reviewed the triage vital signs and the nursing notes.   HISTORY  Chief Complaint Abdominal Pain    HPI Kristine Blake is a 26 y.o. female who is G3 P2. States that she has normal periods every 28 days. States that she has not had a period since November 18 which makes her about 67 and [redacted] weeks pregnant, she is pregnant. Patient states that she had some very faint dark spotting today. Also yesterday. Mild cramping. Denies any significant abdominal pain. Denies any lateralizing pain. Pain is in the midline of the suprapubic region. She is not having pain at this moment. It comes and goes. Scant bleeding.      Past Medical History:  Diagnosis Date  . HSV infection     Patient Active Problem List   Diagnosis Date Noted  . Labor and delivery, indication for care 02/27/2015    History reviewed. No pertinent surgical history.  Prior to Admission medications   Not on File    Allergies Patient has no known allergies.  History reviewed. No pertinent family history.  Social History Social History  Substance Use Topics  . Smoking status: Never Smoker  . Smokeless tobacco: Never Used  . Alcohol use No    Review of Systems Constitutional: No fever/chills Eyes: No visual changes. ENT: No sore throat. No stiff neck no neck pain Cardiovascular: Denies chest pain. Respiratory: Denies shortness of breath. Gastrointestinal:   no vomiting.  No diarrhea.  No constipation. Genitourinary: Negative for dysuria. Musculoskeletal: Negative lower extremity swelling Skin: Negative for rash. Neurological: Negative for severe headaches, focal weakness or numbness. 10-point ROS otherwise negative.  ____________________________________________   PHYSICAL EXAM:  VITAL SIGNS: ED Triage Vitals  Enc Vitals Group     BP 10/20/16 0411 125/84     Pulse  Rate 10/20/16 0411 79     Resp 10/20/16 0411 16     Temp 10/20/16 0411 98.9 F (37.2 C)     Temp Source 10/20/16 0411 Oral     SpO2 10/20/16 0411 100 %     Weight 10/20/16 0408 145 lb (65.8 kg)     Height 10/20/16 0408 5' 2.21" (1.58 m)     Head Circumference --      Peak Flow --      Pain Score 10/20/16 0409 3     Pain Loc --      Pain Edu? --      Excl. in GC? --     Constitutional: Alert and oriented. Well appearing and in no acute distress. Eyes: Conjunctivae are normal. PERRL. EOMI. Head: Atraumatic. Nose: No congestion/rhinnorhea. Mouth/Throat: Mucous membranes are moist.  Oropharynx non-erythematous. Neck: No stridor.   Nontender with no meningismus Cardiovascular: Normal rate, regular rhythm. Grossly normal heart sounds.  Good peripheral circulation. Respiratory: Normal respiratory effort.  No retractions. Lungs CTAB. Abdominal: Soft and nontender. No distention. No guarding no rebound Back:  There is no focal tenderness or step off.  there is no midline tenderness there are no lesions noted. there is no CVA tenderness Musculoskeletal: No lower extremity tenderness, no upper extremity tenderness. No joint effusions, no DVT signs strong distal pulses no edema Neurologic:  Normal speech and language. No gross focal neurologic deficits are appreciated.  Skin:  Skin is warm, dry and intact. No rash noted. Psychiatric: Mood and affect are normal. Speech and behavior are normal.  ____________________________________________   LABS (all labs  ordered are listed, but only abnormal results are displayed)  Labs Reviewed  URINALYSIS, COMPLETE (UACMP) WITH MICROSCOPIC  CBC WITH DIFFERENTIAL/PLATELET  BASIC METABOLIC PANEL  HCG, QUANTITATIVE, PREGNANCY  POC URINE PREG, ED  POCT PREGNANCY, URINE  ABO/RH   ____________________________________________  EKG  I personally interpreted any EKGs ordered by me or  triage  ____________________________________________  RADIOLOGY  I reviewed any imaging ordered by me or triage that were performed during my shift and, if possible, patient and/or family made aware of any abnormal findings. ____________________________________________   PROCEDURES  Procedure(s) performed: None  Procedures  Critical Care performed: None  ____________________________________________   INITIAL IMPRESSION / ASSESSMENT AND PLAN / ED COURSE  Pertinent labs & imaging results that were available during my care of the patient were reviewed by me and considered in my medical decision making (see chart for details).  Patient's presents as a possible bleeding producing over her urine pregnancy is negative. We will check Quant to ensure that she is not pregnant. If that is negative we'll reassess patient. It is in her opinion unusual to miss menstrual cycles, however at this time she appears not to be pregnant.  ----------------------------------------- 5:56 AM on 10/20/2016 ----------------------------------------- Though her pregnancy test here came back as negative , I went ahead and evaluated her as a pregnancy given that she had had 2 positive tests at home.  Thus, she is getting and US. Her quant then came back at 235. I asked the nurse then to recheck the initial pregnancy test, and she reports that it had turned faintly positive. We are evaluating that batch of pregnancy tests and charge nurse Leandro ReasonerRoxanne is aware. Pt rh positive.  This is likely a ab.  ----------------------------------------- 7:45 AM on 10/20/2016 -----------------------------------------  Pelvic exam: Female nurse chaperone present, no external lesions noted, physiologic vaginal discharge noted with no purulent discharge, no cervical motion tenderness, no adnexal tenderness or mass, there is mild uterine tenderness or mass. Significant vaginal bleeding   Discussed with Dr. Tiburcio PeaHarris, patient having a  miscarriage which is clearly an inevitable and likely was missed. Dr. Tiburcio PeaHarris that I give the patient Mepergan and follow her for the next hour or 2 to see what her bleeding does. If that does not do, Dr. Scotty CourtStafford will call Dr. Chauncey CruelStabler. Signed out at the end of my shift.     ____________________________________________   FINAL CLINICAL IMPRESSION(S) / ED DIAGNOSES  Final diagnoses:  None      This chart was dictated using voice recognition software.  Despite best efforts to proofread,  errors can occur which can change meaning.      Jeanmarie PlantJames A Jaiah Weigel, MD 10/20/16 0451    Jeanmarie PlantJames A Alexsis Kathman, MD 10/20/16 [phone]    Jeanmarie PlantJames A Cristoval Teall, MD 10/20/16 [id number]    Jeanmarie PlantJames A Nicolus Ose, MD 10/20/16 [phone]

## 2016-10-20 NOTE — ED Provider Notes (Signed)
 -----------------------------------------   10:34 AM on 10/20/2016 -----------------------------------------  Patient reassessed with Spanish interpreter auto at bedside. Patient reports that after the Methergine she feels that the bleeding has improved but is still continuing. She denies any pain or other new symptoms.  On repeat speculum exam, there is a large amount of clotted blood in the vault. After this was cleared, there is still active fresh bleeding visible, moderately heavy.  [facility] page for further evaluation, consideration of D&C.  ----------------------------------------- 3:18 PM on 10/20/2016 -----------------------------------------  Case discussed with Dr. Chauncey CruelStabler after his assessment. Actually when he went to reevaluate the patient for her decision on Cytotec versus D&C, he reports that she felt that maybe the pregnancy had passed. On reexamination he found that she had completed the miscarriage and the POCs were in the vagina which he removed with a ring forceps.  Patient was counseled. Ibuprofen. Follow up OB in 2 weeks. Rh+.   Sharman CheekPhillip Ryla Cauthon, MD 10/20/16 [phone]

## 2016-10-20 NOTE — ED Triage Notes (Signed)
Pt ambulatory to triage in NAD.  Per tele-interpreter, pt [redacted] weeks pregnant, report dark brown vaginal discharge, reports intermittent abd pain.  Pt reports G3P2.  Pt denies any hx complications with pregnancy.

## 2016-10-20 NOTE — H&P (Addendum)
Obstetrics & Gynecology Consult H&P    Consulting Department: Emergency Department  Consulting Physician: Schuyler Amor, MD  Consulting Question: Missed abortion   History of Present Illness: Patient is a 26 y.o. [id number] presenting with vaginal bleeding in setting of known pregnancy.  ER evaluation included an ultrasound showing a non-viable IUO, CRL of [redacted]w[redacted]d with an elongated gestational sac extending to the cervix.  She was given methergine in the ED has still has some bleeding and continued cramping.  Bleeding has been heavy and contained clots with associated cramping.  Her previous two pregnancies were uncomplicated.  She has not had any trauma or other precipitating events this pregnancy.  This is a desired pregnancy.    Review of Systems:10 point review of systems  Past Medical History:  Past Medical History:  Diagnosis Date  . HSV infection     Past Surgical History:  History reviewed. No pertinent surgical history.  Gynecologic History: No history of STI or abnormal paps  Obstetric History: [id number] Family History:  History reviewed. No pertinent family history.  Social History:  Social History   Social History  . Marital status: Single    Spouse name: N/A  . Number of children: N/A  . Years of education: N/A   Occupational History  . Not on file.   Social History Main Topics  . Smoking status: Never Smoker  . Smokeless tobacco: Never Used  . Alcohol use No  . Drug use: No  . Sexual activity: Yes   Other Topics Concern  . Not on file   Social History Narrative  . No narrative on file    Allergies:  No Known Allergies  Medications: Prior to Admission medications   Not on File    Physical Exam Vitals: Blood pressure 107/72, pulse 83, temperature 98.9 F (37.2 C), temperature source Oral, resp. rate (!) 21, height 5' 2.21" (1.58 m), weight 145 lb (65.8 kg), last menstrual period 07/15/2016, SpO2 99 %, unknown if currently  breastfeeding. General: NAD HEENT: normocephalic, anicteric Pulmonary: No increased work of breathing Cardiovascular: RRR, distal pulses 2+ Abdomen: NABS, soft, non-tender, non-distended Extremities: no edema, erythema, or tenderness Neurologic: Grossly intact Psychiatric: mood appropriate, affect full  Labs: Results for orders placed or performed during the hospital encounter of 10/20/16 (from the past 72 hour(s))  Urinalysis, Complete w Microscopic     Status: Abnormal   Collection Time: 10/20/16  4:31 AM  Result Value Ref Range   Color, Urine COLORLESS (A) YELLOW   APPearance CLEAR (A) CLEAR   Specific Gravity, Urine 1.002 (L) 1.005 - 1.030   pH 6.0 5.0 - 8.0   Glucose, UA NEGATIVE NEGATIVE mg/dL   Hgb urine dipstick NEGATIVE NEGATIVE   Bilirubin Urine NEGATIVE NEGATIVE   Ketones, ur NEGATIVE NEGATIVE mg/dL   Protein, ur NEGATIVE NEGATIVE mg/dL   Nitrite NEGATIVE NEGATIVE   Leukocytes, UA NEGATIVE NEGATIVE   RBC / HPF NONE SEEN 0 - 5 RBC/hpf   WBC, UA NONE SEEN 0 - 5 WBC/hpf   Bacteria, UA NONE SEEN NONE SEEN   Squamous Epithelial / LPF NONE SEEN NONE SEEN  CBC with Differential     Status: Abnormal   Collection Time: 10/20/16  4:31 AM  Result Value Ref Range   WBC 9.7 3.6 - 11.0 K/uL   RBC 4.69 3.80 - 5.20 MIL/uL   Hemoglobin 13.5 12.0 - 16.0 g/dL   HCT 38.9 35.0 - 47.0 %   MCV 82.9 80.0 - 100.0 fL  MCH 28.7 26.0 - 34.0 pg   MCHC 34.6 32.0 - 36.0 g/dL   RDW 13.6 11.5 - 14.5 %   Platelets 248 150 - 440 K/uL   Neutrophils Relative % 51 %   Neutro Abs 5.0 1.4 - 6.5 K/uL   Lymphocytes Relative 39 %   Lymphs Abs 3.7 (H) 1.0 - 3.6 K/uL   Monocytes Relative 7 %   Monocytes Absolute 0.7 0.2 - 0.9 K/uL   Eosinophils Relative 3 %   Eosinophils Absolute 0.2 0 - 0.7 K/uL   Basophils Relative 0 %   Basophils Absolute 0.0 0 - 0.1 K/uL  Basic metabolic panel     Status: None   Collection Time: 10/20/16  4:31 AM  Result Value Ref Range   Sodium 139 135 - 145 mmol/L    Potassium 3.8 3.5 - 5.1 mmol/L   Chloride 108 101 - 111 mmol/L   CO2 25 22 - 32 mmol/L   Glucose, Bld 96 65 - 99 mg/dL   BUN 9 6 - 20 mg/dL   Creatinine, Ser 0.53 0.44 - 1.00 mg/dL   Calcium 9.0 8.9 - 10.3 mg/dL   GFR calc non Af Amer >60 >60 mL/min   GFR calc Af Amer >60 >60 mL/min    Comment: (NOTE) The eGFR has been calculated using the CKD EPI equation. This calculation has not been validated in all clinical situations. eGFR's persistently <60 mL/min signify possible Chronic Kidney Disease.    Anion gap 6 5 - 15  hCG, quantitative, pregnancy     Status: Abnormal   Collection Time: 10/20/16  4:31 AM  Result Value Ref Range   hCG, Beta Chain, Quant, S 235 (H) <5 mIU/mL    Comment:          GEST. AGE      CONC.  (mIU/mL)   <=1 WEEK        5 - 50     2 WEEKS       50 - 500     3 WEEKS       100 - 10,000     4 WEEKS     1,000 - 30,000     5 WEEKS     3,500 - 115,000   6-8 WEEKS     12,000 - 270,000    12 WEEKS     15,000 - 220,000        FEMALE AND NON-PREGNANT FEMALE:     LESS THAN 5 mIU/mL   ABO/Rh     Status: None   Collection Time: 10/20/16  4:31 AM  Result Value Ref Range   ABO/RH(D) A POS   Pregnancy, urine POC     Status: None   Collection Time: 10/20/16  4:40 AM  Result Value Ref Range   Preg Test, Ur NEGATIVE NEGATIVE    Comment:        THE SENSITIVITY OF THIS METHODOLOGY IS >24 mIU/mL   Pregnancy, urine POC     Status: Abnormal   Collection Time: 10/20/16  5:56 AM  Result Value Ref Range   Preg Test, Ur POSITIVE (A) NEGATIVE    Comment:        THE SENSITIVITY OF THIS METHODOLOGY IS >24 mIU/mL     Imaging US Ob Comp Less 14 Wks  Result Date: 10/20/2016 CLINICAL DATA:  Vaginal bleeding. EXAM: OBSTETRIC <14 WK [place] AND TRANSVAGINAL OB US TECHNIQUE: Both transabdominal and transvaginal ultrasound examinations were performed for complete evaluation of the gestation  as well as the maternal uterus, adnexal regions, and pelvic cul-de-sac. Transvaginal technique  was performed to assess early pregnancy. COMPARISON:  09/25/2014 . FINDINGS: Intrauterine gestational sac: A single elongated gestational sac noted extending into the cervix. Yolk sac:  Not visualized Embryo:  Visualized Cardiac Activity: Not visualized CRL:  2.5 cm 9 w   1 d                  [place] EDC: 05/24/2017 Subchorionic hemorrhage:  None visualized. Maternal uterus/adnexae: No focal abnormalities identified. IMPRESSION: Eight single elongated gestational sac noted extending into the cervix. This is abnormal. Age fetal pole at 9 weeks 1 day is visualized. No fetal heart rate noted. Findings are suspicious but not yet definitive for failed pregnancy. Recommend follow-up US in 10-14 days for definitive diagnosis. This recommendation follows SRU consensus guidelines: Diagnostic Criteria for Nonviable Pregnancy Early in the First Trimester. [place] Med 2013; 856:3149-70. Electronically Signed   By: Marcello Moores  Register   On: 10/20/2016 06:23   US Ob Transvaginal  Result Date: 10/20/2016 CLINICAL DATA:  Vaginal bleeding. EXAM: OBSTETRIC <14 WK [place] AND TRANSVAGINAL OB US TECHNIQUE: Both transabdominal and transvaginal ultrasound examinations were performed for complete evaluation of the gestation as well as the maternal uterus, adnexal regions, and pelvic cul-de-sac. Transvaginal technique was performed to assess early pregnancy. COMPARISON:  09/25/2014 . FINDINGS: Intrauterine gestational sac: A single elongated gestational sac noted extending into the cervix. Yolk sac:  Not visualized Embryo:  Visualized Cardiac Activity: Not visualized CRL:  2.5 cm 9 w   1 d                  [place] EDC: 05/24/2017 Subchorionic hemorrhage:  None visualized. Maternal uterus/adnexae: No focal abnormalities identified. IMPRESSION: Eight single elongated gestational sac noted extending into the cervix. This is abnormal. Age fetal pole at 9 weeks 1 day is visualized. No fetal heart rate noted. Findings are suspicious but not yet definitive  for failed pregnancy. Recommend follow-up US in 10-14 days for definitive diagnosis. This recommendation follows SRU consensus guidelines: Diagnostic Criteria for Nonviable Pregnancy Early in the First Trimester. [place] Med 2013; 263:7858-85. Electronically Signed   By: Marcello Moores  Register   On: 10/20/2016 06:23    Assessment: 26 y.o. [id number] with 34w1dmissed abortion  Plan:  Condolences were offered to the patient and her family.  I stressed that while emotionally difficult, that this did not occur because of an actions or inactions by the patient.  Somewhere between 10-20% of identified first trimester pregnancies will unfortunately end in miscarriage.  Given this relatively high incidence rate, further diagnostic testing such as chromosome analysis is generally not clinically relevant nor recommended.  Although the chromosomal abnormalities have been implicated at rates as high as 70% in some studies, these are generally random and do not infer and increased risk of recurrence with subsequent pregnancies.  However, 3 or more consecutive first trimester losses are relatively uncommon, and these patient generally do benefit from additional work up to determine a potential modifiable etiology.   We briefly discussed management options including expectant management, medical management, and surgical management as well as their relative success rates and complications. Approximately 80% of first trimester miscarriages will pass successfully but may require a time frame of up to 8 weeks ([place]150 May 2015 "Early Pregnancy Loss").  Medical management using 8045m of misoprostil administered every 3-hrs as needed for up to 3 doses speeds up the time  frame to completion significantly, has literature supporting its use up to 63 days or 86w0dgestation and results in a passage rate of 84-85% (Pension scheme managerBulletin 143 March 2014 "Medical Management of First-Trimester Abortion").  Dilation and  curettage has the highest rate of uterine evacuation, but carries with is operative cost, surgical and anesthetic risk.  While these risk are relatively small they nevertheless include infection, bleeding, uterine perforation, formation of uterine synechia, and in rare cases death.     Clinically I am confident of the diagnosis base on ultrasound findings as detailed below:  "Society of Radiologyst in Ultrasound Guidelines for Transvaginal Ultrasonographic Diagnosis of Early Pregnancy Loss" and adopted in [place] 150, May 2015 (reaffirmed 2017) "Early Pregnancy Loss"  - CRL of 764mor greater and absence of fetal heartbeat  - Mean sac diameter of 252mr greater and no embryo  - Absence of embryo with a discernable heartbeat 2 week after initial ultrasound showing a gestational sac without a yolk sac  - Absence of embryo with a discernable heartbeat 11 days after initial ultrasound showing a gestational sac with  yolk sac present  but   However, I do not want any doubts in the patient's mind regarding the plan of management she chooses to adopt.  I will allow the patient and her family to discuss management options.    I will speak to the patient after she is done talking with her husband to determine plan of management.  If she chooses medical management I would like admit the patient to observeration on the the third floor.  The patient is Rh positive and rhogam is therefore not indicated.

## 2016-10-20 NOTE — Progress Notes (Signed)
The patient requested an additional ultrasound to re-confirm absence of heartbeat prior to making a management decision.  I performed a quick bedside ultrasound which showed evacuation of the uterine cavity.  Speculum exam revealed products of conception at the cervical os.  These were easily removed using a ring forceps.  Bleeding was minimal following removal and the patient reported improvement in her cramping.  I will follow up with the patient in 2 weeks to ensure she has not additional questions beyond what we talked about today and to see how she is holding up emotionally.

## 2016-10-20 NOTE — ED Notes (Signed)
OB consulting provider at bedside.

## 2016-10-23 LAB — SURGICAL PATHOLOGY

## 2017-01-12 DIAGNOSIS — O26891 Other specified pregnancy related conditions, first trimester: Secondary | ICD-10-CM | POA: Diagnosis not present

## 2017-01-12 DIAGNOSIS — O208 Other hemorrhage in early pregnancy: Secondary | ICD-10-CM | POA: Diagnosis not present

## 2017-01-12 DIAGNOSIS — Z3A01 Less than 8 weeks gestation of pregnancy: Secondary | ICD-10-CM | POA: Insufficient documentation

## 2017-01-12 DIAGNOSIS — R109 Unspecified abdominal pain: Secondary | ICD-10-CM | POA: Insufficient documentation

## 2017-01-12 DIAGNOSIS — O209 Hemorrhage in early pregnancy, unspecified: Secondary | ICD-10-CM | POA: Diagnosis present

## 2017-01-12 LAB — POCT PREGNANCY, URINE: Preg Test, Ur: POSITIVE — AB

## 2017-01-12 LAB — ABO/RH: ABO/RH(D): A POS

## 2017-01-12 NOTE — ED Triage Notes (Signed)
Patient c/o vaginal bleeding, and lower back cramping during pregnancy. Patient reports that she had a positive pregnancy test done at the health clinic. Pt reports hx of miscarriage in February.

## 2017-01-13 ENCOUNTER — Emergency Department
Admission: EM | Admit: 2017-01-13 | Discharge: 2017-01-13 | Disposition: A | Discharge status: Home / Self Care | Payer: Medicaid Other | Attending: Emergency Medicine | Admitting: Emergency Medicine

## 2017-01-13 ENCOUNTER — Emergency Department: Payer: Medicaid Other

## 2017-01-13 DIAGNOSIS — Z3490 Encounter for supervision of normal pregnancy, unspecified, unspecified trimester: Secondary | ICD-10-CM

## 2017-01-13 DIAGNOSIS — O200 Threatened abortion: Secondary | ICD-10-CM

## 2017-01-13 LAB — URINALYSIS, ROUTINE W REFLEX MICROSCOPIC
Bilirubin Urine: NEGATIVE
GLUCOSE, UA: NEGATIVE mg/dL
HGB URINE DIPSTICK: NEGATIVE
Ketones, ur: NEGATIVE mg/dL
LEUKOCYTES UA: NEGATIVE
Nitrite: NEGATIVE
PROTEIN: NEGATIVE mg/dL
SPECIFIC GRAVITY, URINE: 1.013 (ref 1.005–1.030)
pH: 6 (ref 5.0–8.0)

## 2017-01-13 LAB — HCG, QUANTITATIVE, PREGNANCY: hCG, Beta Chain, Quant, S: 178924 m[IU]/mL — ABNORMAL HIGH (ref <5)

## 2017-01-13 NOTE — ED Provider Notes (Signed)
[facility] Emergency Department Provider Note  ____________________________________________   First MD Initiated Contact with Patient 01/13/17 [phone]     (approximate)  I have reviewed the triage vital signs and the nursing notes.   HISTORY  Chief Complaint Vaginal Bleeding (7-[redacted] weeks pregnant)  The patient and/or family speak(s) Spanish.  They understand they have the right to the use of a hospital interpreter, however at this time they prefer to speak directly with me in Spanish.  They know that they can ask for an interpreter at any time.   HPI Kristine Blake is a 26 y.o. female G4 P2 Ab 1 (spontaneous, several months ago) who presents for evaluation of light vaginal bleeding while pregnant.  She had a positive pregnancy test recently at the health clinic and based on dates think she is at about [redacted] weeks gestation.  Over the last week she is noted blood on the toilet paper when she urinates.  This is been happening with increased frequency although she is not having any bleeding except for when she urinates.  She is having mild intermittent cramps.  Nothing particular makes any of her symptoms better or worse.  She denies recent illness, fever/chills, chest pain, shortness of breath, nausea, vomiting, dysuria.   Past Medical History:  Diagnosis Date  . HSV infection     Patient Active Problem List   Diagnosis Date Noted  . Labor and delivery, indication for care 02/27/2015    History reviewed. No pertinent surgical history.  Prior to Admission medications   Medication Sig Start Date End Date Taking? Authorizing Provider  ibuprofen (ADVIL,MOTRIN) 600 MG tablet Take 1 tablet (600 mg total) by mouth every 6 (six) hours as needed. 10/20/16   Sharman Cheek, MD    Allergies Patient has no known allergies.  History reviewed. No pertinent family history.  Social History Social History  Substance Use Topics  . Smoking status: Never Smoker    . Smokeless tobacco: Never Used  . Alcohol use No    Review of Systems Constitutional: No fever/chills Cardiovascular: Denies chest pain. Respiratory: Denies shortness of breath. Gastrointestinal: No abdominal pain.  No nausea, no vomiting.  No diarrhea.  No constipation. Genitourinary: Vaginal bleeding during pregnancy noted only when she urinates.  Negative for dysuria. Musculoskeletal: Negative for neck pain.  Negative for back pain. Neurological: Negative for headaches, focal weakness or numbness.   ____________________________________________   PHYSICAL EXAM:  VITAL SIGNS: ED Triage Vitals  Enc Vitals Group     BP 01/12/17 2248 124/81     Pulse Rate 01/12/17 2248 75     Resp 01/12/17 2248 16     Temp 01/12/17 2248 98.4 F (36.9 C)     Temp Source 01/12/17 2248 Oral     SpO2 01/12/17 2248 100 %     Weight 01/12/17 2254 146 lb (66.2 kg)     Height 01/12/17 2254 5\' 2"  (1.575 m)     Head Circumference --      Peak Flow --      Pain Score 01/12/17 2251 4     Pain Loc --      Pain Edu? --      Excl. in GC? --     Constitutional: Alert and oriented. Well appearing and in no acute distress. Eyes: Conjunctivae are normal.  Head: Atraumatic. Nose: No congestion/rhinnorhea. Mouth/Throat: Mucous membranes are moist. Neck: No stridor.  No meningeal signs.   Cardiovascular: Normal rate, regular rhythm. Good peripheral  circulation. Grossly normal heart sounds. Respiratory: Normal respiratory effort.  No retractions. Lungs CTAB. Gastrointestinal: Soft and nontender. No distention.  Genitourinary: deferred Musculoskeletal: No lower extremity tenderness nor edema. No gross deformities of extremities. Neurologic:  Normal speech and language. No gross focal neurologic deficits are appreciated.  Skin:  Skin is warm, dry and intact. No rash noted. Psychiatric: Mood and affect are normal. Speech and behavior are normal.  ____________________________________________    LABS (all labs ordered are listed, but only abnormal results are displayed)  Labs Reviewed  HCG, QUANTITATIVE, PREGNANCY - Abnormal; Notable for the following:       Result Value   hCG, Beta Chain, Mahalia LongestQuant, S [id number] (*)    All other components within normal limits  URINALYSIS, ROUTINE W REFLEX MICROSCOPIC - Abnormal; Notable for the following:    Color, Urine YELLOW (*)    APPearance CLEAR (*)    All other components within normal limits  POCT PREGNANCY, URINE - Abnormal; Notable for the following:    Preg Test, Ur POSITIVE (*)    All other components within normal limits  POC URINE PREG, ED  ABO/RH   ____________________________________________  EKG  None - EKG not ordered by ED physician ____________________________________________  RADIOLOGY   [place] Ob Comp < 14 Wks  Result Date: 01/13/2017 CLINICAL DATA:  Pregnant patient in first-trimester pregnancy with vaginal bleeding. EXAM: OBSTETRIC <14 WK US AND TRANSVAGINAL OB US TECHNIQUE: Both transabdominal and transvaginal ultrasound examinations were performed for complete evaluation of the gestation as well as the maternal uterus, adnexal regions, and pelvic cul-de-sac. Transvaginal technique was performed to assess early pregnancy. COMPARISON:  None this pregnancy. FINDINGS: Intrauterine gestational sac: Single Yolk sac:  Visualized. Embryo:  Visualized. Cardiac Activity: Visualized. Heart Rate: 133  bpm CRL:  11  mm   7 w   2 d                  US EDC: 08/30/2017 Subchorionic hemorrhage:  None visualized. Maternal uterus/adnexae: Cyst in the right ovary measures 3.3 cm, there is normal ovarian blood flow. The left ovary is normal with blood flow. No pelvic free fluid. IMPRESSION: Single live intrauterine pregnancy estimated gestational age 740 weeks 2 days for estimated date of delivery 08/30/2017. No subchorionic hemorrhage. A 3.3 cm right ovarian cyst may be a corpus luteum or physiologic. Electronically Signed   By: Rubye OaksMelanie  Ehinger  M.D.   On: 01/13/2017 04:34   [place] Ob Transvaginal  Result Date: 01/13/2017 CLINICAL DATA:  Pregnant patient in first-trimester pregnancy with vaginal bleeding. EXAM: OBSTETRIC <14 WK US AND TRANSVAGINAL OB US TECHNIQUE: Both transabdominal and transvaginal ultrasound examinations were performed for complete evaluation of the gestation as well as the maternal uterus, adnexal regions, and pelvic cul-de-sac. Transvaginal technique was performed to assess early pregnancy. COMPARISON:  None this pregnancy. FINDINGS: Intrauterine gestational sac: Single Yolk sac:  Visualized. Embryo:  Visualized. Cardiac Activity: Visualized. Heart Rate: 133  bpm CRL:  11  mm   7 w   2 d                  US EDC: 08/30/2017 Subchorionic hemorrhage:  None visualized. Maternal uterus/adnexae: Cyst in the right ovary measures 3.3 cm, there is normal ovarian blood flow. The left ovary is normal with blood flow. No pelvic free fluid. IMPRESSION: Single live intrauterine pregnancy estimated gestational age 740 weeks 2 days for estimated date of delivery 08/30/2017. No subchorionic hemorrhage. A 3.3 cm right ovarian cyst may  be a corpus luteum or physiologic. Electronically Signed   By: Rubye Oaks M.D.   On: 01/13/2017 04:34    ____________________________________________   PROCEDURES  Critical Care performed: No   Procedure(s) performed:   Procedures   ____________________________________________   INITIAL IMPRESSION / ASSESSMENT AND PLAN / ED COURSE  Pertinent labs & imaging results that were available during my care of the patient were reviewed by me and considered in my medical decision making (see chart for details).  The patient is understandably concerned because she had a spontaneous miscarriage about 3 months ago but she is having minimal vaginal bleeding at this time and no other significant symptoms.  Her vital signs are stable.  She understands and agrees with the plan for an ultrasound and that we will  discuss  Results and implications.  She is A+ and does not require RhoGAM.  Clinical Course as of Jan 13 537  Sat Jan 13, 2017  1610 The patient's ultrasound is reassuring with a single intrauterine pregnancy at about [redacted] weeks gestation.  No evidence of subchorionic hemorrhage.  I updated the patient with results and gave my usual and customary return and follow-up recommendations and precautions.  [CF]    Clinical Course User Index [CF] Loleta Rose, MD    ____________________________________________  FINAL CLINICAL IMPRESSION(S) / ED DIAGNOSES  Final diagnoses:  Threatened miscarriage  Pregnancy at early stage     MEDICATIONS GIVEN DURING THIS VISIT:  Medications - No data to display   NEW OUTPATIENT MEDICATIONS STARTED DURING THIS VISIT:  New Prescriptions   No medications on file    Modified Medications   No medications on file    Discontinued Medications   No medications on file     Note:  This document was prepared using Dragon voice recognition software and may include unintentional dictation errors.    Loleta Rose, MD 01/13/17 [phone]

## 2017-01-13 NOTE — ED Notes (Signed)
US called to inquire when pt would have ultrasound, per Lupita Leashonna, pt should be taken to US in about 10 min

## 2017-01-13 NOTE — ED Notes (Signed)
Pt taken to US

## 2017-02-02 ENCOUNTER — Other Ambulatory Visit (HOSPITAL_COMMUNITY): Payer: Self-pay | Admitting: Family Medicine

## 2017-02-02 DIAGNOSIS — Z3482 Encounter for supervision of other normal pregnancy, second trimester: Secondary | ICD-10-CM

## 2017-04-04 ENCOUNTER — Ambulatory Visit: Admission: RE | Admit: 2017-04-04 | Payer: Self-pay | Source: Ambulatory Visit

## 2017-04-05 ENCOUNTER — Ambulatory Visit
Admission: RE | Admit: 2017-04-05 | Discharge: 2017-04-05 | Disposition: A | Discharge status: Home / Self Care | Payer: Self-pay | Source: Ambulatory Visit | Attending: Family Medicine | Admitting: Family Medicine

## 2017-04-05 DIAGNOSIS — Z3482 Encounter for supervision of other normal pregnancy, second trimester: Secondary | ICD-10-CM

## 2017-04-05 DIAGNOSIS — Z3689 Encounter for other specified antenatal screening: Secondary | ICD-10-CM | POA: Insufficient documentation

## 2017-04-05 DIAGNOSIS — Z3A19 19 weeks gestation of pregnancy: Secondary | ICD-10-CM | POA: Insufficient documentation

## 2017-08-20 ENCOUNTER — Encounter: Payer: Self-pay | Admitting: Obstetrics and Gynecology

## 2017-08-20 ENCOUNTER — Inpatient Hospital Stay
Admission: EM | Admit: 2017-08-20 | Discharge: 2017-08-22 | DRG: 806 | Disposition: A | Discharge status: Home / Self Care | Payer: Medicaid Other | Attending: Obstetrics and Gynecology | Admitting: Obstetrics and Gynecology

## 2017-08-20 DIAGNOSIS — E669 Obesity, unspecified: Secondary | ICD-10-CM | POA: Diagnosis present

## 2017-08-20 DIAGNOSIS — O99214 Obesity complicating childbirth: Secondary | ICD-10-CM | POA: Diagnosis present

## 2017-08-20 DIAGNOSIS — D62 Acute posthemorrhagic anemia: Secondary | ICD-10-CM | POA: Diagnosis not present

## 2017-08-20 DIAGNOSIS — A6000 Herpesviral infection of urogenital system, unspecified: Secondary | ICD-10-CM | POA: Diagnosis present

## 2017-08-20 DIAGNOSIS — Z3483 Encounter for supervision of other normal pregnancy, third trimester: Secondary | ICD-10-CM | POA: Diagnosis present

## 2017-08-20 DIAGNOSIS — O9081 Anemia of the puerperium: Secondary | ICD-10-CM | POA: Diagnosis not present

## 2017-08-20 DIAGNOSIS — Z3A38 38 weeks gestation of pregnancy: Secondary | ICD-10-CM

## 2017-08-20 DIAGNOSIS — O9832 Other infections with a predominantly sexual mode of transmission complicating childbirth: Secondary | ICD-10-CM | POA: Diagnosis present

## 2017-08-20 LAB — CBC
HEMATOCRIT: 40.2 % (ref 35.0–47.0)
HEMATOCRIT: 33.2 % — AB (ref 35.0–47.0)
HEMOGLOBIN: 11.2 g/dL — AB (ref 12.0–16.0)
Hemoglobin: 13.5 g/dL (ref 12.0–16.0)
MCH: 28.1 pg (ref 26.0–34.0)
MCH: 28.2 pg (ref 26.0–34.0)
MCHC: 33.7 g/dL (ref 32.0–36.0)
MCHC: 33.6 g/dL (ref 32.0–36.0)
MCV: 83.5 fL (ref 80.0–100.0)
MCV: 83.8 fL (ref 80.0–100.0)
Platelets: 231 10*3/uL (ref 150–440)
Platelets: 205 10*3/uL (ref 150–440)
RBC: 4.82 MIL/uL (ref 3.80–5.20)
RBC: 3.96 MIL/uL (ref 3.80–5.20)
RDW: 14.8 % — AB (ref 11.5–14.5)
RDW: 15.1 % — AB (ref 11.5–14.5)
WBC: 11 10*3/uL (ref 3.6–11.0)
WBC: 20 10*3/uL — AB (ref 3.6–11.0)

## 2017-08-20 LAB — TYPE AND SCREEN
ABO/RH(D): A POS
ANTIBODY SCREEN: NEGATIVE

## 2017-08-20 MED ORDER — MEASLES, MUMPS & RUBELLA VAC ~~LOC~~ INJ
0.5000 mL | INJECTION | Freq: Once | SUBCUTANEOUS | Status: DC
  Filled 2017-08-20: qty 0.5

## 2017-08-20 MED ORDER — OXYTOCIN 10 UNIT/ML IJ SOLN
INTRAMUSCULAR | Status: AC
  Filled 2017-08-20: qty 2

## 2017-08-20 MED ORDER — IBUPROFEN 600 MG PO TABS
600.0000 mg | ORAL_TABLET | Freq: Four times a day (QID) | ORAL | Status: DC
  Administered 2017-08-20 – 2017-08-22 (×7): 600 mg via ORAL
  Filled 2017-08-20 (×8): qty 1

## 2017-08-20 MED ORDER — BENZOCAINE-MENTHOL 20-0.5 % EX AERO: 1.0000 "application " | INHALATION_SPRAY | CUTANEOUS | Status: DC | PRN

## 2017-08-20 MED ORDER — CARBOPROST TROMETHAMINE 250 MCG/ML IM SOLN
INTRAMUSCULAR | Status: AC
  Administered 2017-08-20: 12:00:00
  Filled 2017-08-20: qty 1

## 2017-08-20 MED ORDER — SENNOSIDES-DOCUSATE SODIUM 8.6-50 MG PO TABS
2.0000 | ORAL_TABLET | ORAL | Status: DC
  Administered 2017-08-21 – 2017-08-22 (×2): 2 via ORAL
  Filled 2017-08-20 (×2): qty 2

## 2017-08-20 MED ORDER — HYDROMORPHONE HCL 1 MG/ML IJ SOLN
INTRAMUSCULAR | Status: AC
  Administered 2017-08-20: 0.5 mg
  Filled 2017-08-20: qty 1

## 2017-08-20 MED ORDER — ZOLPIDEM TARTRATE 5 MG PO TABS: 5.0000 mg | ORAL_TABLET | Freq: Every evening | ORAL | Status: DC | PRN

## 2017-08-20 MED ORDER — BISACODYL 10 MG RE SUPP
10.0000 mg | Freq: Every day | RECTAL | Status: DC | PRN
  Filled 2017-08-20: qty 1

## 2017-08-20 MED ORDER — ONDANSETRON HCL 4 MG/2ML IJ SOLN: 4.0000 mg | Freq: Four times a day (QID) | INTRAMUSCULAR | Status: DC | PRN

## 2017-08-20 MED ORDER — LIDOCAINE HCL (PF) 1 % IJ SOLN
INTRAMUSCULAR | Status: AC
  Filled 2017-08-20: qty 30

## 2017-08-20 MED ORDER — DIPHENOXYLATE-ATROPINE 2.5-0.025 MG PO TABS: 2.0000 | ORAL_TABLET | Freq: Four times a day (QID) | ORAL | Status: DC | PRN

## 2017-08-20 MED ORDER — FLEET ENEMA 7-19 GM/118ML RE ENEM: 1.0000 | ENEMA | Freq: Every day | RECTAL | Status: DC | PRN

## 2017-08-20 MED ORDER — OXYTOCIN 40 UNITS IN LACTATED RINGERS INFUSION - SIMPLE MED
2.5000 [IU]/h | INTRAVENOUS | Status: DC
  Administered 2017-08-20: 2.5 [IU]/h via INTRAVENOUS
  Filled 2017-08-20: qty 1000

## 2017-08-20 MED ORDER — SODIUM CHLORIDE 0.9 % IV SOLN: 250.0000 mL | INTRAVENOUS | Status: DC | PRN

## 2017-08-20 MED ORDER — OXYCODONE-ACETAMINOPHEN 5-325 MG PO TABS: 2.0000 | ORAL_TABLET | ORAL | Status: DC | PRN

## 2017-08-20 MED ORDER — MISOPROSTOL 200 MCG PO TABS
ORAL_TABLET | ORAL | Status: AC
  Administered 2017-08-20: 800 ug via RECTAL
  Filled 2017-08-20: qty 4

## 2017-08-20 MED ORDER — SODIUM CHLORIDE 0.9% FLUSH
3.0000 mL | Freq: Two times a day (BID) | INTRAVENOUS | Status: DC
  Administered 2017-08-20 – 2017-08-22 (×3): 3 mL via INTRAVENOUS

## 2017-08-20 MED ORDER — LACTATED RINGERS IV SOLN
INTRAVENOUS | Status: DC
  Administered 2017-08-20: 1000 mL via INTRAVENOUS

## 2017-08-20 MED ORDER — LIDOCAINE HCL (PF) 1 % IJ SOLN: 30.0000 mL | INTRAMUSCULAR | Status: DC | PRN

## 2017-08-20 MED ORDER — WITCH HAZEL-GLYCERIN EX PADS: 1.0000 "application " | MEDICATED_PAD | CUTANEOUS | Status: DC | PRN

## 2017-08-20 MED ORDER — OXYCODONE HCL 5 MG PO TABS: 5.0000 mg | ORAL_TABLET | ORAL | Status: DC | PRN

## 2017-08-20 MED ORDER — COCONUT OIL OIL: 1.0000 "application " | TOPICAL_OIL | Status: DC | PRN

## 2017-08-20 MED ORDER — ACETAMINOPHEN 325 MG PO TABS
650.0000 mg | ORAL_TABLET | ORAL | Status: DC | PRN
  Administered 2017-08-20 (×2): 650 mg via ORAL
  Filled 2017-08-20 (×2): qty 2

## 2017-08-20 MED ORDER — LACTATED RINGERS IV SOLN: 500.0000 mL | INTRAVENOUS | Status: DC | PRN

## 2017-08-20 MED ORDER — SOD CITRATE-CITRIC ACID 500-334 MG/5ML PO SOLN: 30.0000 mL | ORAL | Status: DC | PRN

## 2017-08-20 MED ORDER — TETANUS-DIPHTH-ACELL PERTUSSIS 5-2.5-18.5 LF-MCG/0.5 IM SUSP: 0.5000 mL | Freq: Once | INTRAMUSCULAR | Status: DC

## 2017-08-20 MED ORDER — SIMETHICONE 80 MG PO CHEW: 80.0000 mg | CHEWABLE_TABLET | ORAL | Status: DC | PRN

## 2017-08-20 MED ORDER — DIPHENHYDRAMINE HCL 25 MG PO CAPS: 25.0000 mg | ORAL_CAPSULE | Freq: Four times a day (QID) | ORAL | Status: DC | PRN

## 2017-08-20 MED ORDER — DIBUCAINE 1 % RE OINT: 1.0000 "application " | TOPICAL_OINTMENT | RECTAL | Status: DC | PRN

## 2017-08-20 MED ORDER — AMMONIA AROMATIC IN INHA
RESPIRATORY_TRACT | Status: AC
  Filled 2017-08-20: qty 10

## 2017-08-20 MED ORDER — PRENATAL MULTIVITAMIN CH
1.0000 | ORAL_TABLET | Freq: Every day | ORAL | Status: DC
  Administered 2017-08-20 – 2017-08-22 (×3): 1 via ORAL
  Filled 2017-08-20 (×3): qty 1

## 2017-08-20 MED ORDER — CEFAZOLIN SODIUM-DEXTROSE 2-4 GM/100ML-% IV SOLN
2.0000 g | Freq: Once | INTRAVENOUS | Status: DC
  Filled 2017-08-20: qty 100

## 2017-08-20 MED ORDER — ONDANSETRON HCL 4 MG PO TABS: 4.0000 mg | ORAL_TABLET | ORAL | Status: DC | PRN

## 2017-08-20 MED ORDER — OXYTOCIN BOLUS FROM INFUSION
500.0000 mL | Freq: Once | INTRAVENOUS | Status: AC
  Administered 2017-08-20: 500 mL via INTRAVENOUS

## 2017-08-20 MED ORDER — METHYLERGONOVINE MALEATE 0.2 MG/ML IJ SOLN
INTRAMUSCULAR | Status: AC
  Administered 2017-08-20: 0.2 mg
  Filled 2017-08-20: qty 1

## 2017-08-20 MED ORDER — ONDANSETRON HCL 4 MG/2ML IJ SOLN: 4.0000 mg | INTRAMUSCULAR | Status: DC | PRN

## 2017-08-20 MED ORDER — CEFAZOLIN SODIUM 10 G IJ SOLR
2.0000 g | Freq: Once | INTRAMUSCULAR | Status: AC
  Administered 2017-08-20: 2 g via INTRAVENOUS
  Filled 2017-08-20: qty 2000

## 2017-08-20 MED ORDER — OXYCODONE-ACETAMINOPHEN 5-325 MG PO TABS: 1.0000 | ORAL_TABLET | ORAL | Status: DC | PRN

## 2017-08-20 MED ORDER — ACETAMINOPHEN 325 MG PO TABS: 650.0000 mg | ORAL_TABLET | ORAL | Status: DC | PRN

## 2017-08-20 MED ORDER — SODIUM CHLORIDE 0.9% FLUSH: 3.0000 mL | INTRAVENOUS | Status: DC | PRN

## 2017-08-20 NOTE — Lactation Note (Signed)
This note was copied from a baby's chart. Lactation Consultation Note  Patient Name: Boy Margrett RudSantos Vasquez Reyes [id number]'s Date: 08/20/2017    This is mom's third baby.  Mom was unsuccessful with breast feediong other 2 babies reporting she attempted but had difficulty latching to right breast stating "it sinks in".  Mom's right nipple is large and invaginated but when compressed everts well.  Mom had PPH with 1600 ml blood loss and is exhausted.  Mom said she wanted to breast and formula feed on admission.  Explained to mom importance of only breast feeding for now so Diego could learn how to latch effectively to both breasts.  Let mom remain in lying back position for comfort and with pillow support positioned Diego in biological modified cross cradle hold skin to skin.  Hand expressed colostrum from right breast and demonstrated how to sandwich breast for deep latch. After a few attempts, he opened wide and latched with strong vigorous sucking everting nipple easily.  Sustained latch for 12 minutes with strong sucking and occasional swallows.  Once came off right breast, continued to root.  Left nipple is not invaginated or inverted, but firm areola.  Difficulty compressing for deep latch initially.  Once [place] opened wide and pulled in breast, he started strong, rhythmic sucking on left breast as well.  Mom breast fed one more time on left breast.  [facility] and [facility].  FOB held HawaiiDiego and bottle fed him 5 ml formula against lactation's advice an hour later.  Reiterated through spanish interpreter importance of breast feeding frequently to bring in mature milk and ensure a plentiful supply.  Discussed supply and demand, routine newborn feeding patterns and normal course of lactation.  Lactation name and number written on white board and encouraged to call for questions, concerns or assistance.   Maternal Data    Feeding Feeding Type: Bottle Fed - Formula Nipple Type: Slow -  flow  LATCH Score                   Interventions    Lactation Tools Discussed/Used     Consult Status      Louis MeckelWilliams, Elgie Maziarz Kay 08/20/2017, 4:31 PM

## 2017-08-20 NOTE — Discharge Summary (Signed)
Obstetrical Discharge Summary  Patient Name: Kristine Blake DOB: 11/03/1990 MRN: [id number]  Date of Admission: 08/20/2017 Date of Discharge: 08/22/2017  Primary OB:  [facility]  Gestational Age at Delivery: 7635w5d   Antepartum complications:   1. Abnormal 1hr GTT- normal 3h GTT 2. Obesity 3. Hx HSV-  On suppression.    Admitting Diagnosis: active labor Secondary Diagnosis: Patient Active Problem List   Diagnosis Date Noted  . Indication for care or intervention related to labor and delivery 08/20/2017  . Labor and delivery, indication for care 02/27/2015    Augmentation: AROM Complications: Hemorrhage>108200mL Intrapartum complications/course:  26yo [id number] at 38+5wks presented in active labor at 9cm, with a bulging bag. Declined epidural, accepted nitrous. AROM for meconium fluid. Pushed over intact perineum for an OP baby, no shoulder, no nuchal cord. Baby placed on maternal abdomen and active and crying. Delayed cord clamping and FOB cut the cord. Small clitoral tear without bleeding noted.  Her placenta delivered with expression intact but with trailing membranes, and she began to bleed briskly and promptly. She received a bolus of 40u iv pitocin in the 3rd stage, and bimanual massage only helped minimally. Fundus entirely floppy. IV dilaudid given, staff assist code called, and 0.2mg  IM methergine given. 800mcg rectal cytotec given and bimanual extraction of clots. Bleeding continued, and im hemabate given 250mcg. Bleeding slowed. Fundus firm and bleeding minimal at conclusion of case. Evaluation of cervix and vagina and no lacerations noted.   Total quant blood loss: 1650ml   Date of Delivery: 08/20/17 Delivered By: Christeen DouglasBethany Beasley  Delivery Type: spontaneous vaginal delivery Anesthesia: none Episiotomy: none Newborn Data: Live born female "Diego" Birth Weight: 8 lb 2.9 oz (3710 g) APGAR: 8, 9  Newborn Delivery   Birth date/time:  08/20/2017  11:14:00 Delivery type:  Vaginal, Spontaneous     Discharge Physical Exam:  BP 98/61 (BP Location: Right Arm)   Pulse 79   Temp 97.7 F (36.5 C) (Oral)   Resp 18   Ht 4\' 11"  (1.499 m)   Wt 74.8 kg (165 lb)   LMP 11/22/2016 (Exact Date)   SpO2 100%   BMI 33.33 kg/m   General: NAD CV: RRR Pulm: CTABL, nl effort ABD: s/nd/nt, fundus firm and below the umbilicus Lochia: moderate DVT Evaluation: LE non-ttp, no evidence of DVT on exam.  Hemoglobin  Date Value Ref Range Status  08/22/2017 8.4 (L) 12.0 - 16.0 g/dL Final   HGB  Date Value Ref Range Status  04/21/2013 9.4 (L) 12.0 - 16.0 g/dL Final   HCT  Date Value Ref Range Status  08/22/2017 26.2 (L) 35.0 - 47.0 % Final  04/19/2013 40.4 35.0 - 47.0 % Final    Post partum course: uncomplicated Postpartum Procedures: none Disposition: stable, discharge to home. Baby Feeding: breastmilk Baby Disposition: home with mom  Rh Immune globulin given: n/a Rubella vaccine given: n/a - Tdap: given 06/04/17 - Flu: given 06/04/17  Contraception: family planning  Prenatal Labs:   Blood type/Rh A Pos  Antibody screen neg  Rubella Immune  Varicella Immune  RPR NR  HBsAg Neg  HIV NR  GC neg  Chlamydia neg  Genetic screening negative  1 hour GTT 158  3 hour GTT normal  GBS negative      Plan:  Kristine Blake was discharged to home in good condition. [facility] in 4-6 weeks   Discharge Medications: Allergies as of 08/22/2017   No Known Allergies  Medication List    TAKE these medications   multivitamin-prenatal 27-0.8 MG Tabs tablet Take 1 tablet by mouth daily at 12 noon.       Follow-up Information    Christeen DouglasBeasley, Bethany, MD Follow up in 4 week(s).   Specialty:  Obstetrics and Gynecology Contact information: [address] [place] [address] [phone]           Signed: ----- Ranae Plumberhelsea Tyrik Stetzer, MD Attending Obstetrician and  Gynecologist [facility], Department of OB/GYN [facility]

## 2017-08-20 NOTE — Progress Notes (Signed)
Kristine Blake is a 26 y.o. [phone] at 7224w5d Subjective: Feeling contractions  Objective: Temp (!) 97.3 F (36.3 C) (Oral)   Ht 4\' 11"  (1.499 m)   Wt 74.8 kg (165 lb)   LMP 11/22/2016 (Exact Date)   BMI 33.33 kg/m  No intake/output data recorded. No intake/output data recorded.  FHT:  Cat Ii UC:   regular, every 3 minutes SVE:   Dilation: 9 Effacement (%): 100 Station: -1 Exam by:: R. Mcvey, CNM  Labs: Lab Results  Component Value Date   WBC 11.0 08/20/2017   HGB 13.5 08/20/2017   HCT 40.2 08/20/2017   MCV 83.5 08/20/2017   PLT 231 08/20/2017    Assessment / Plan: Spontaneous labor, progressing normally  AROM for meconium fluid Anticipate vaginal delivery Christeen DouglasBethany Willye Javier 08/20/2017, 10:45 AM

## 2017-08-20 NOTE — H&P (Signed)
OB History & Physical   History of Present Illness:  Chief Complaint:   HPI:  Kristine Blake is a 26 y.o. [id number] female at 6541w5d dated by LMP 11/22/16; EDD 29 Aug 2017.  She presents to L&D for labor  Active FM onset of ctx @ 0300 currently every 2-323min minutes Intact membranes bloody show -normal    Pregnancy Issues: 1. Abnormal 1hr GTT- normal 3h GTT 2. Obesity 3. Hx HSV-  On suppression.    Maternal Medical History:   Past Medical History:  Diagnosis Date  . HSV infection     History reviewed. No pertinent surgical history.  No Known Allergies  Prior to Admission medications   Medication Sig Start Date End Date Taking? Authorizing Provider  Prenatal Vit-Fe Fumarate-FA (MULTIVITAMIN-PRENATAL) 27-0.8 MG TABS tablet Take 1 tablet by mouth daily at 12 noon.   Yes [provider]     [facility]   Social History: She  reports that  has never smoked. she has never used smokeless tobacco. She reports that she does not drink alcohol or use drugs.  Family History: family history is not on file.   Review of Systems: A full review of systems was performed and negative except as noted in the HPI.     Physical Exam:  Vital Signs: Temp (!) 97.3 F (36.3 C) (Oral)   LMP 11/22/2016 (Exact Date)  General: no acute distress. Breathing well with UCs HEENT: normocephalic, atraumatic Heart: regular rate & rhythm.  No murmurs/rubs/gallops Lungs: clear to auscultation bilaterally, normal respiratory effort Abdomen: soft, gravid, non-tender;  EFW: 8 lbs Pelvic:   External: Normal external female genitalia; normal bloody show noted. No e/o HSV lesions.   Cervix: Dilation: 9 / Effacement (%): 100 / Station: -1 with BBOW   Extremities: non-tender, symmetric, trace edema bilaterally.  DTRs:2+ Neurologic: Alert & oriented x 3.    No results found for this or any previous visit (from the past 24 hour(s)).  Pertinent Results:   Prenatal Labs: Blood type/Rh A Pos  Antibody screen neg  Rubella Immune  Varicella Immune  RPR NR  HBsAg Neg  HIV NR  GC neg  Chlamydia neg  Genetic screening negative  1 hour GTT 158  3 hour GTT normal  GBS negative   FHT: Cat I tracing, 140bpm, min-mod variability, + accels, early and variable decels with UCs TOCO: q2-673min, Palpate mod.  SVE:  Dilation: 9 / Effacement (%): 100 / Station: -1    Cephalic by leopolds and exam  No results found.  Assessment:  Kristine Blake is a 26 y.o. [phone] female at 3241w5d with active labor.   Plan:  1. Admit to Labor & Delivery; consents reviewed and obtained  2. Fetal Well being  - Fetal Tracing:cat II tracing - Group B Streptococcus ppx indicated: negative - Presentation: cephalic confirmed by exam   3. Routine OB: - Prenatal labs reviewed, as above - Rh: A Pos - CBC, T&S, RPR on admit - Clear fluids, IVF  4. Monitoring of Labor -  Contractions: external toco in place -  Pelvis proven to 7.5lbs  -  Plan for continuous fetal monitoring  -  Maternal pain control if desired - Anticipate vaginal delivery  5. Post Partum Planning: - Infant feeding: Breast - Contraception: NFP - Tdap: given 06/04/17 - Flu: given 06/04/17   Waddell Iten A, CNM 08/20/17 9:12 AM

## 2017-08-20 NOTE — OB Triage Note (Signed)
Pt c/o ctx that started around 3 am. Vaginal Bloody Show. CTX every 2-3 minutes 10/10 on pain scale

## 2017-08-21 LAB — CBC
HEMATOCRIT: 26.2 % — AB (ref 35.0–47.0)
HEMOGLOBIN: 8.6 g/dL — AB (ref 12.0–16.0)
MCH: 27.6 pg (ref 26.0–34.0)
MCHC: 32.7 g/dL (ref 32.0–36.0)
MCV: 84.4 fL (ref 80.0–100.0)
Platelets: 186 10*3/uL (ref 150–440)
RBC: 3.1 MIL/uL — AB (ref 3.80–5.20)
RDW: 15 % — ABNORMAL HIGH (ref 11.5–14.5)
WBC: 14.2 10*3/uL — ABNORMAL HIGH (ref 3.6–11.0)

## 2017-08-21 LAB — RPR: RPR Ser Ql: NONREACTIVE

## 2017-08-21 NOTE — Lactation Note (Signed)
This note was copied from a baby's chart. Lactation Consultation Note  Patient Name: Boy Margrett RudSantos Vasquez Reyes Today's Date: 08/21/2017    [facility], the RN, Chanetta MarshallKiera stated that Mom is choosing to just bottle feed now and does not have questions for LC.    Maternal Data    Feeding    LATCH Score                   Interventions    Lactation Tools Discussed/Used     Consult Status      Sunday CornSandra Clark Deysy Schabel 08/21/2017, 3:19 PM

## 2017-08-21 NOTE — Plan of Care (Signed)
Patient's vital signs stable; fundus firm; small amount rubra lochia; good appetite; good po fluids; voiding qs; had BM; spanish interpreter used to translate; good maternal infant bonding observed; patient denies dizziness when up to bathroom; viewed Purple Cry video; pain controlled with po motrin and tylenol.

## 2017-08-21 NOTE — Progress Notes (Signed)
Post Partum Day 1  Subjective: **Interpreter present and provided interpretation during visit**  Doing well, no concerns. Ambulating without difficulty, pain managed with PO meds, tolerating regular diet, and voiding without difficulty.   No fever/chills, chest pain, shortness of breath, nausea/vomiting, or leg pain. No nipple or breast pain. in.  Objective: BP 98/64 (BP Location: Left Arm)   Pulse 74   Temp 98 F (36.7 C) (Oral)   Resp 18   Ht 4\' 11"  (1.499 m)   Wt 74.8 kg (165 lb)   LMP 11/22/2016 (Exact Date)   SpO2 99%   BMI 33.33 kg/m    Physical Exam:  General: alert, cooperative, appears stated age and no distress CV: RRR Pulm: nl effort, CTABL Abdomen: soft, non-tender, active bowel sounds Uterine Fundus: firm Lochia: appropriate DVT Evaluation: No evidence of DVT seen on physical exam. No cords or calf tenderness. No significant calf/ankle edema.  Recent Labs    08/20/17 1533 08/21/17 0342  HGB 11.2* 8.6*  HCT 33.2* 26.2*  WBC 20.0* 14.2*  PLT 205 186    Assessment/Plan: 26 y.o. [id number] postpartum day #1  -Continue routine PP care -Plans natural family planning for contraception.  -Formula feeding. Reviewed process of milk coming in and drying up.  -Acute blood loss anemia - hemodynamically stable and asymptomatic; CBC for tomorrow morning ordered.   Disposition: Desires discharge today. Reviewed that due to blood loss after delivery, it is in her best interest to stay in the hospital another night and allow for a recheck of her hemoglobin level, while continuing to monitor her vitals and physical condition. If all is stable, she should be able to be discharged to home tomorrow.   Reviewed labs, vitals, and plan with Dr. Christeen DouglasBethany Beasley, who is in agreement.    LOS: 1 day   Kristine Blake, CNM 08/21/2017, 1:49 PM   ----- Kristine Blake Certified Nurse Midwife [facility]

## 2017-08-22 LAB — CBC
HCT: 26.2 % — ABNORMAL LOW (ref 35.0–47.0)
Hemoglobin: 8.4 g/dL — ABNORMAL LOW (ref 12.0–16.0)
MCH: 27.4 pg (ref 26.0–34.0)
MCHC: 32.2 g/dL (ref 32.0–36.0)
MCV: 85.1 fL (ref 80.0–100.0)
PLATELETS: 196 10*3/uL (ref 150–440)
RBC: 3.08 MIL/uL — AB (ref 3.80–5.20)
RDW: 15.3 % — ABNORMAL HIGH (ref 11.5–14.5)
WBC: 10 10*3/uL (ref 3.6–11.0)

## 2017-08-22 NOTE — Discharge Instructions (Signed)

## 2017-08-22 NOTE — Progress Notes (Signed)
Pt discharged with infant.  Discharge instructions, prescriptions and follow up appointment given to and reviewed with pt. Pt verbalized understanding. Escorted out by auxillary. 

## 2018-08-13 ENCOUNTER — Other Ambulatory Visit: Payer: Self-pay | Admitting: Nurse Practitioner

## 2018-08-13 DIAGNOSIS — Z3481 Encounter for supervision of other normal pregnancy, first trimester: Secondary | ICD-10-CM

## 2018-08-13 LAB — OB RESULTS CONSOLE HGB/HCT, BLOOD: Hemoglobin: 12.3

## 2018-08-14 LAB — OB RESULTS CONSOLE HGB/HCT, BLOOD: HCT: 36 (ref 29–41)

## 2018-08-14 LAB — OB RESULTS CONSOLE ABO/RH: ABO/RH(D): A POS

## 2018-08-14 LAB — OB RESULTS CONSOLE GC/CHLAMYDIA
Chlamydia: NEGATIVE
Gonorrhea: NEGATIVE

## 2018-08-14 LAB — OB RESULTS CONSOLE HEPATITIS B SURFACE ANTIGEN: Hepatitis B Surface Ag: NEGATIVE

## 2018-08-14 LAB — OB RESULTS CONSOLE RPR: RPR: NONREACTIVE

## 2018-08-14 LAB — OB RESULTS CONSOLE PLATELET COUNT: Platelets: 294000

## 2018-08-14 LAB — OB RESULTS CONSOLE ANTIBODY SCREEN: Antibody Screen: NEGATIVE

## 2018-08-15 ENCOUNTER — Ambulatory Visit
Admission: RE | Admit: 2018-08-15 | Discharge: 2018-08-15 | Disposition: A | Discharge status: Home / Self Care | Payer: Self-pay | Source: Ambulatory Visit | Attending: Nurse Practitioner | Admitting: Nurse Practitioner

## 2018-08-15 DIAGNOSIS — Z3481 Encounter for supervision of other normal pregnancy, first trimester: Secondary | ICD-10-CM | POA: Insufficient documentation

## 2018-08-16 ENCOUNTER — Other Ambulatory Visit: Payer: Self-pay | Admitting: Nurse Practitioner

## 2018-08-16 DIAGNOSIS — Z369 Encounter for antenatal screening, unspecified: Secondary | ICD-10-CM

## 2018-08-19 LAB — OB RESULTS CONSOLE HIV ANTIBODY (ROUTINE TESTING): HIV: NONREACTIVE

## 2018-08-28 NOTE — L&D Delivery Note (Signed)
Delivery Note At 2:13 AM a viable female child was delivered via Vaginal, Spontaneous (Presentation:straight OA).  APGAR:8, 9; weight pending.   Placenta status: spontanous, intact.  Cord: 3VC, nuchal cord x 1 removed on perineum without complications: Slightly more bleeding than expected with initial release of placenta, 869mcg of cytotec placed rectally along with pitocin bolus.  Cord pH: N.A  Anesthesia:   Episiotomy: None Lacerations: None Suture Repair: none Est. Blood Loss (mL): 400  Mom to postpartum.  Baby to Couplet care / Skin to Skin.  Malachy Mood 03/21/2019, 2:24 AM

## 2018-09-12 ENCOUNTER — Ambulatory Visit: Payer: MEDICAID

## 2018-10-28 IMAGING — US US OB TRANSVAGINAL
1 series · 13 of 28 positions shown · non-contrast
Comparison: 09/25/2014 .

CLINICAL DATA: Vaginal bleeding.

EXAM:
OBSTETRIC <14 WK US AND TRANSVAGINAL OB US
TECHNIQUE: Both transabdominal and transvaginal ultrasound examinations were
performed for complete evaluation of the gestation as well as the
maternal uterus, adnexal regions, and pelvic cul-de-sac.
Transvaginal technique was performed to assess early pregnancy.

[Series 1: us ob transvaginal · 0.22mm/px · 13 of 54 slices shown]
[im 2/54]
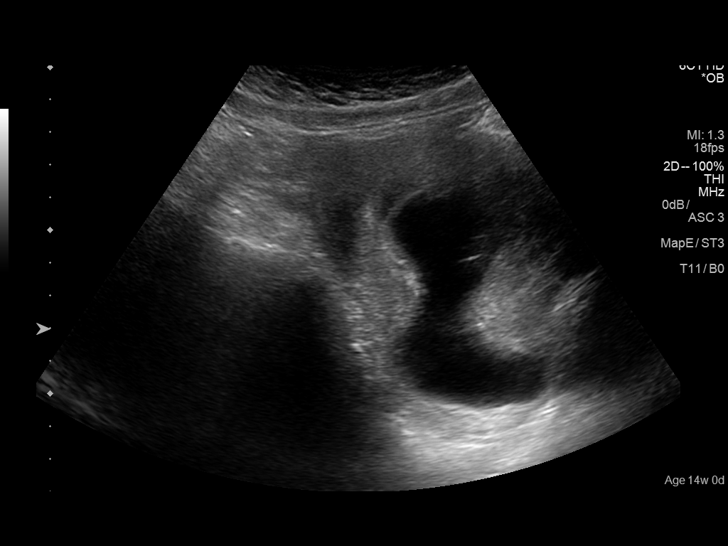
[im 6/54]
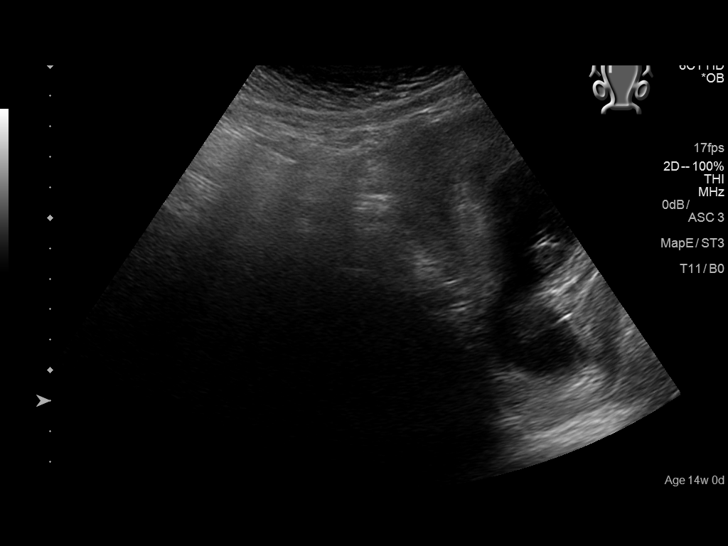
[im 10/54]
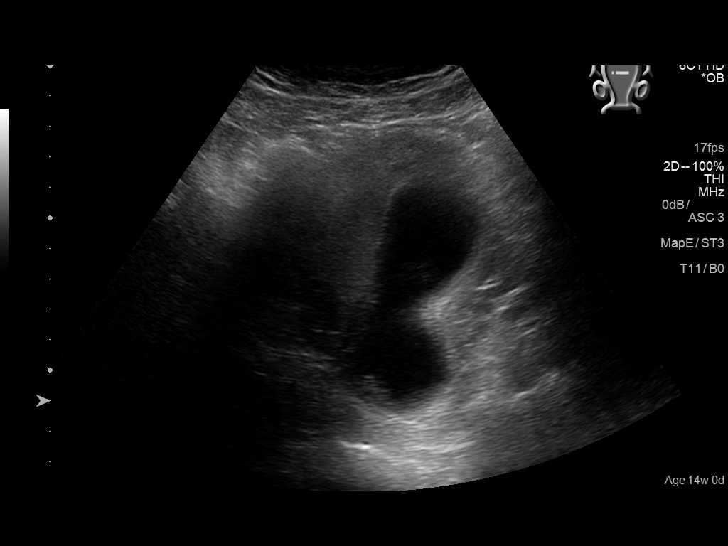
[im 14/54]
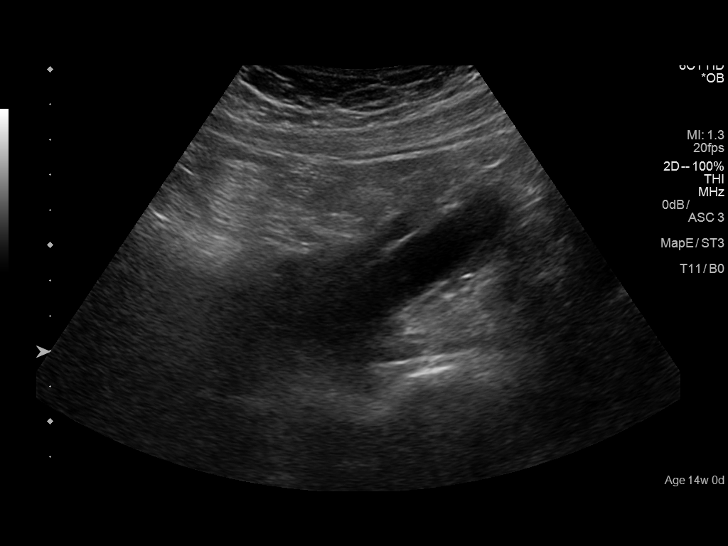
[im 18/54]
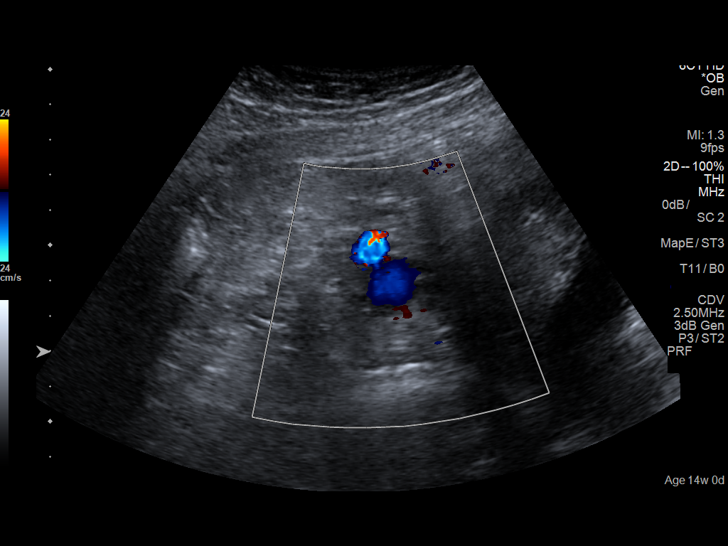
[im 22/54]
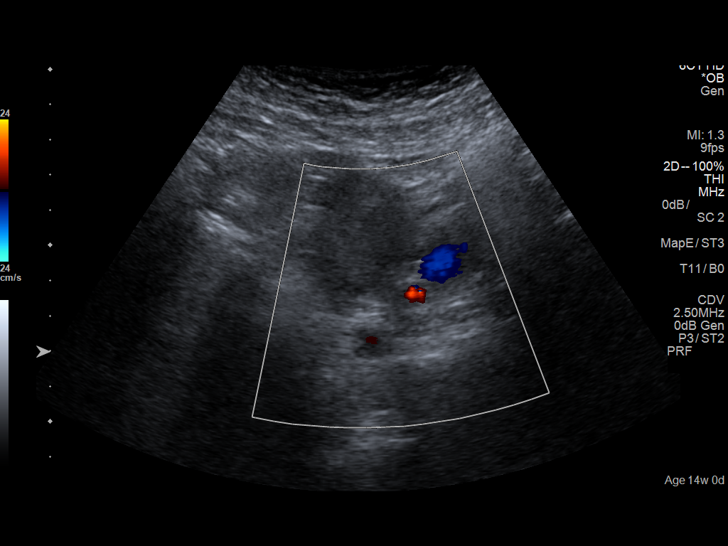
[im 28/54]
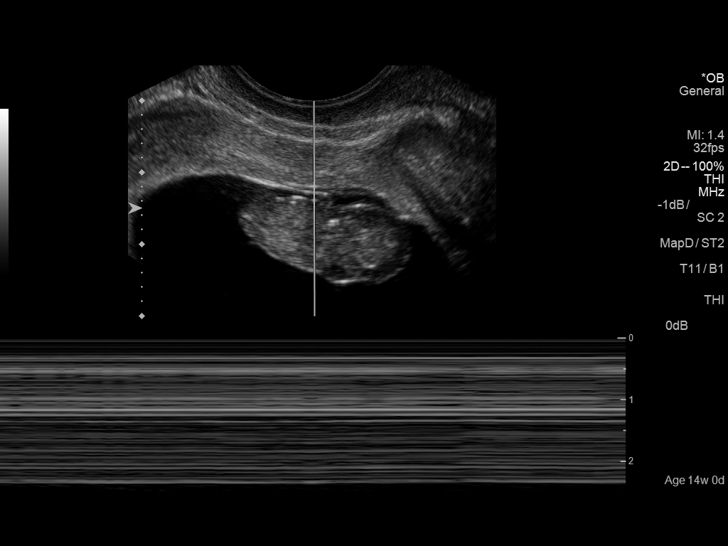
[im 32/54]
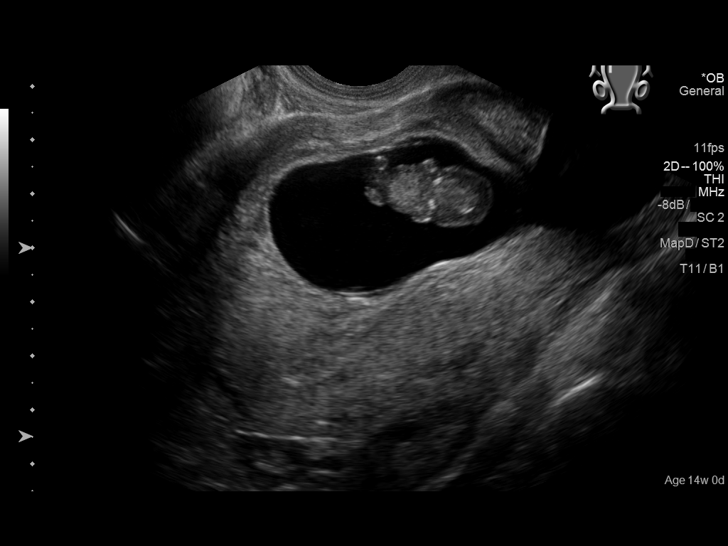
[im 36/54]
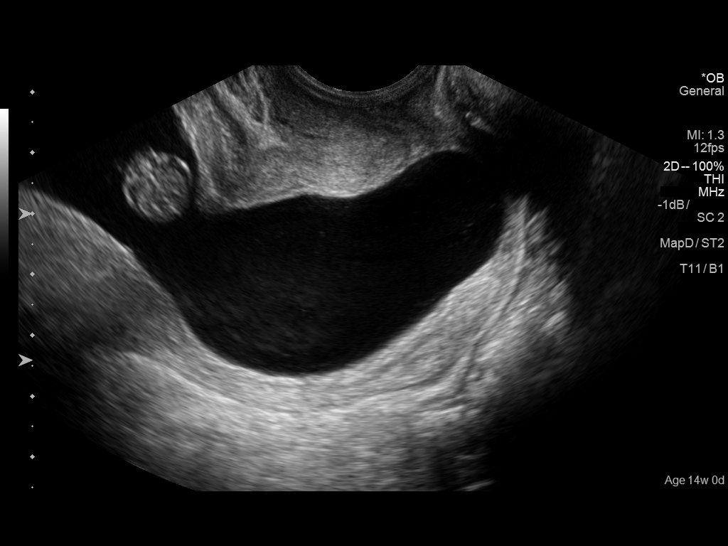
[im 40/54]
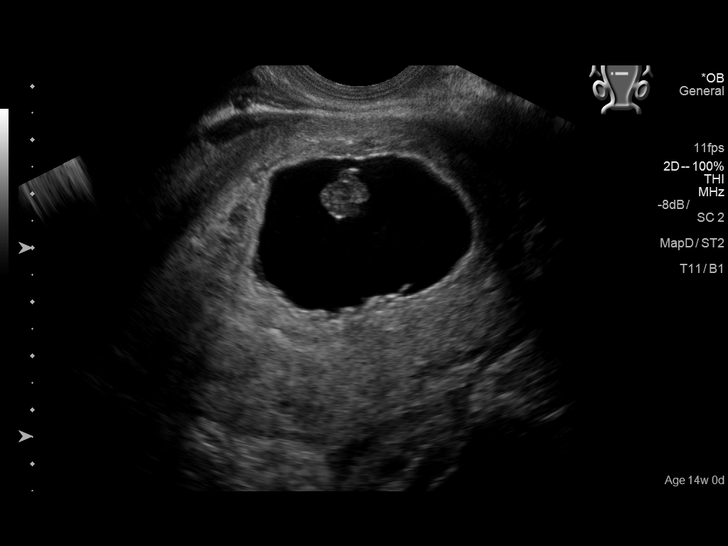
[im 44/54]
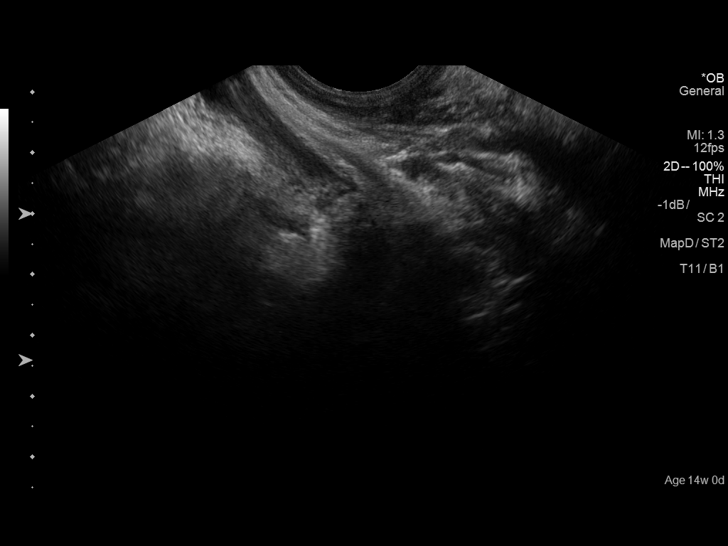
[im 48/54]
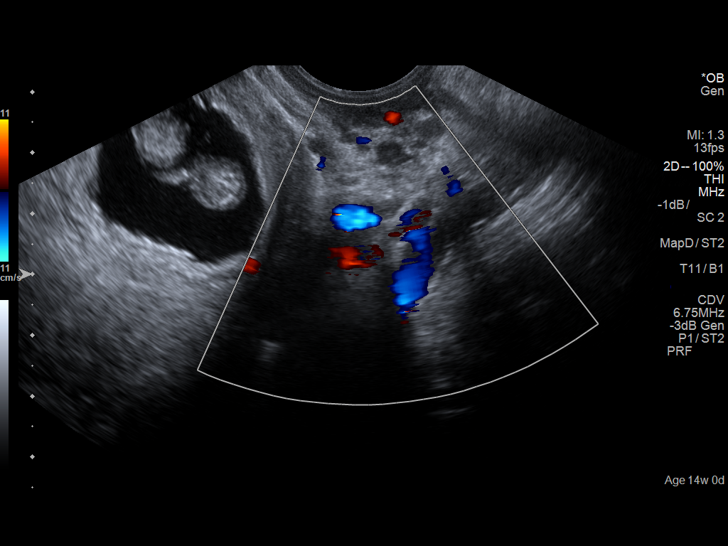
[im 52/54]
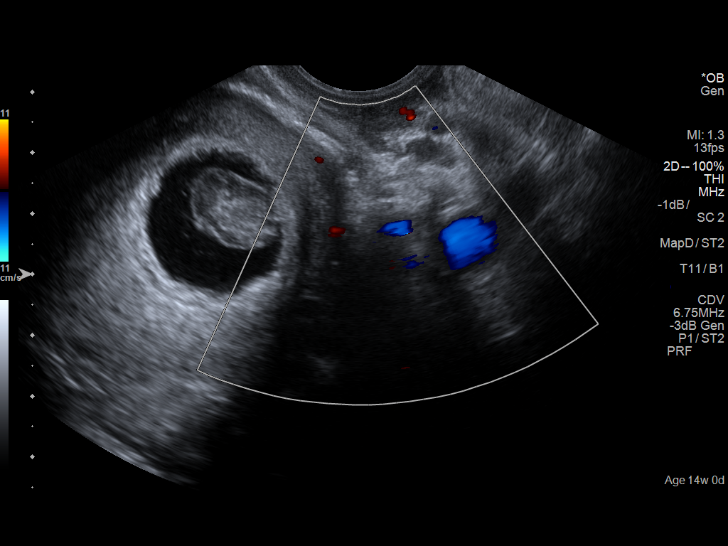

[13 of 28 positions shown; findings below may reference images not displayed]

FINDINGS: Intrauterine gestational sac: A single elongated gestational sac
noted extending into the cervix.

Yolk sac:  Not visualized

Embryo:  Visualized

Cardiac Activity: Not visualized

CRL:  2.5 cm 9 w   1 d                  US EDC: 05/24/2017

Subchorionic hemorrhage:  None visualized.

Maternal uterus/adnexae: No focal abnormalities identified.
IMPRESSION: Eight single elongated gestational sac noted extending into the
cervix. This is abnormal. Age fetal pole at 9 weeks 1 day is
visualized. No fetal heart rate noted. Findings are suspicious but
not yet definitive for failed pregnancy. Recommend follow-up US in
10-14 days for definitive diagnosis. This recommendation follows SRU
consensus guidelines: Diagnostic Criteria for Nonviable Pregnancy
Early in the First Trimester. N Engl J Med 5881; [DATE].

## 2018-11-07 ENCOUNTER — Other Ambulatory Visit: Payer: Self-pay | Admitting: Obstetrics & Gynecology

## 2018-11-07 DIAGNOSIS — Z3482 Encounter for supervision of other normal pregnancy, second trimester: Secondary | ICD-10-CM

## 2018-11-11 ENCOUNTER — Other Ambulatory Visit: Payer: Self-pay

## 2018-11-14 ENCOUNTER — Other Ambulatory Visit: Payer: Self-pay

## 2018-11-14 ENCOUNTER — Ambulatory Visit (INDEPENDENT_AMBULATORY_CARE_PROVIDER_SITE_OTHER): Payer: Self-pay

## 2018-11-14 DIAGNOSIS — Z3482 Encounter for supervision of other normal pregnancy, second trimester: Secondary | ICD-10-CM

## 2018-11-14 DIAGNOSIS — Z363 Encounter for antenatal screening for malformations: Secondary | ICD-10-CM

## 2019-01-02 LAB — OB RESULTS CONSOLE RPR: RPR: NONREACTIVE

## 2019-01-02 LAB — OB RESULTS CONSOLE HIV ANTIBODY (ROUTINE TESTING): HIV: NONREACTIVE

## 2019-01-02 LAB — OB RESULTS CONSOLE HGB/HCT, BLOOD: Hemoglobin: 11.7

## 2019-01-21 IMAGING — US US OB TRANSVAGINAL
1 series · 14 of 28 positions shown · non-contrast
Comparison: None this pregnancy.

CLINICAL DATA: Pregnant patient in first-trimester pregnancy with
vaginal bleeding.

EXAM:
OBSTETRIC <14 WK US AND TRANSVAGINAL OB US
TECHNIQUE: Both transabdominal and transvaginal ultrasound examinations were
performed for complete evaluation of the gestation as well as the
maternal uterus, adnexal regions, and pelvic cul-de-sac.
Transvaginal technique was performed to assess early pregnancy.

[Series 1: us ob transvaginal · 0.20mm/px · 14 of 108 slices shown]
[im 4/108]
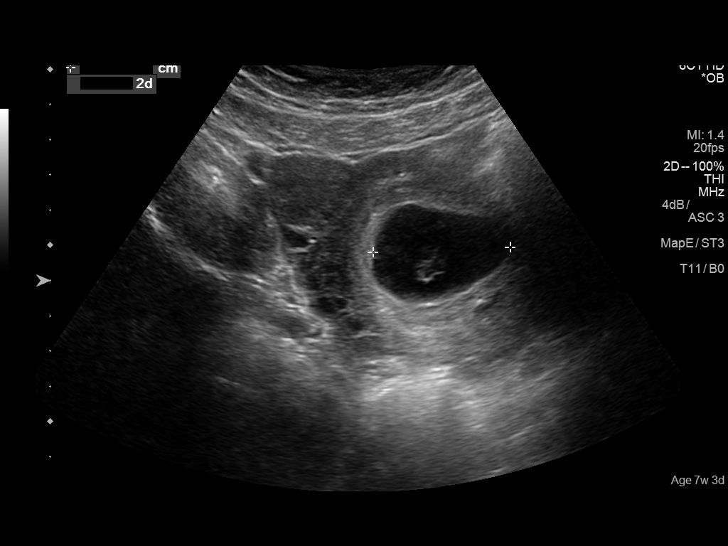
[im 12/108]
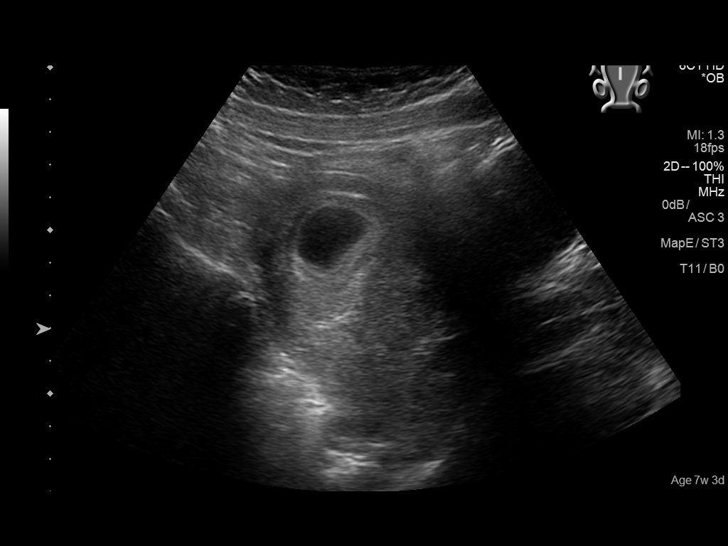
[im 20/108]
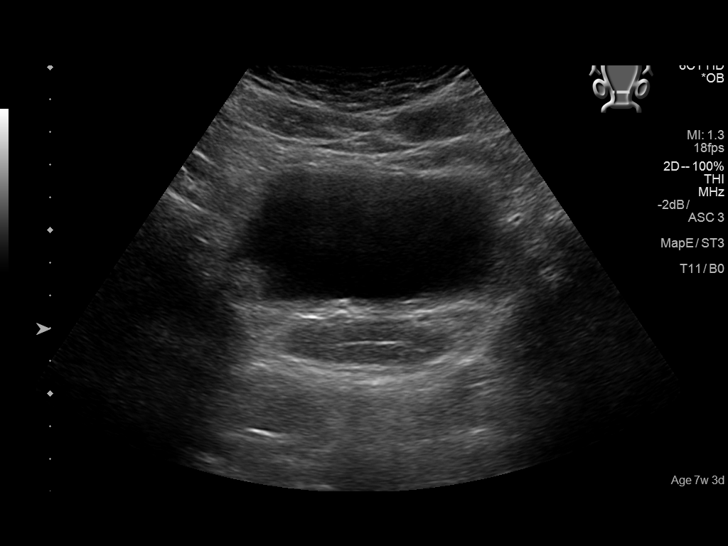
[im 28/108]
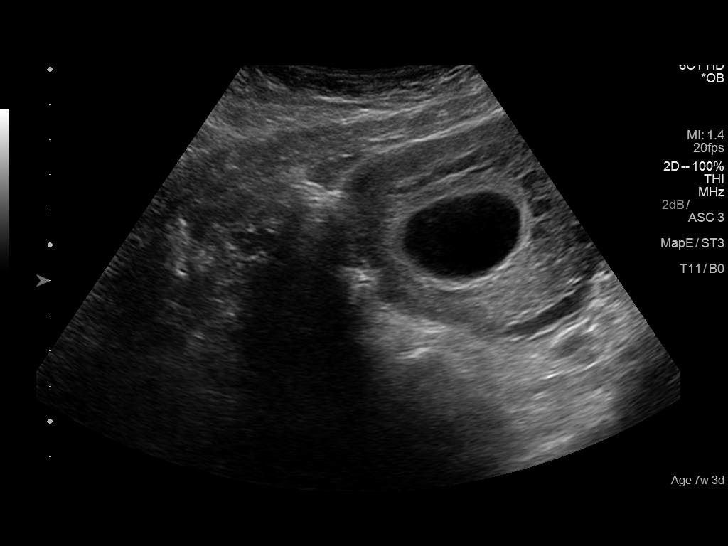
[im 36/108]
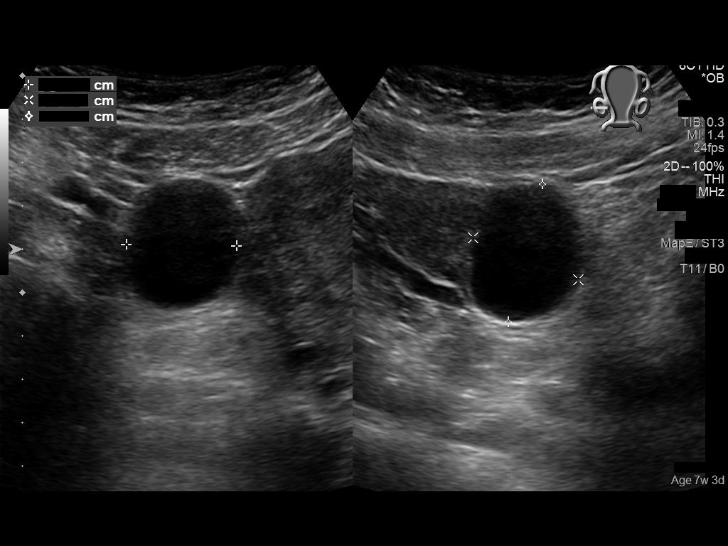
[im 44/108]
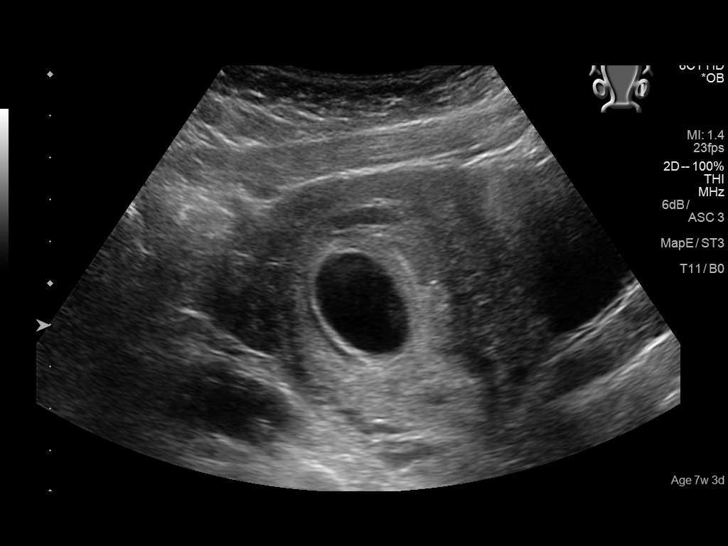
[im 52/108]
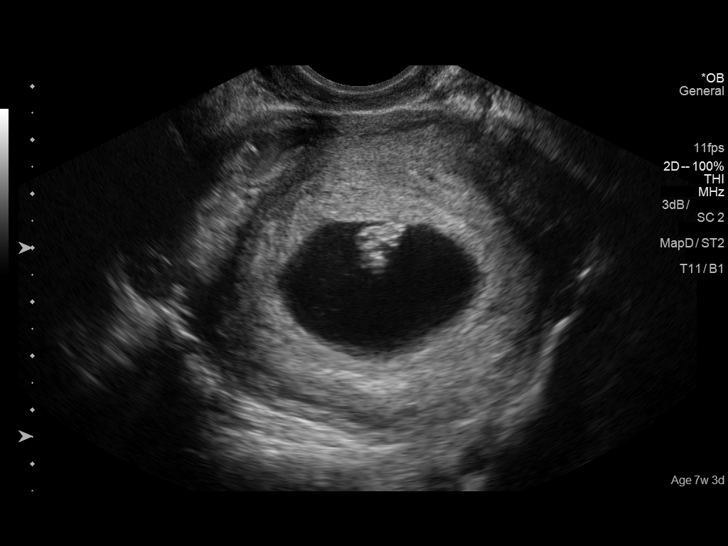
[im 60/108]
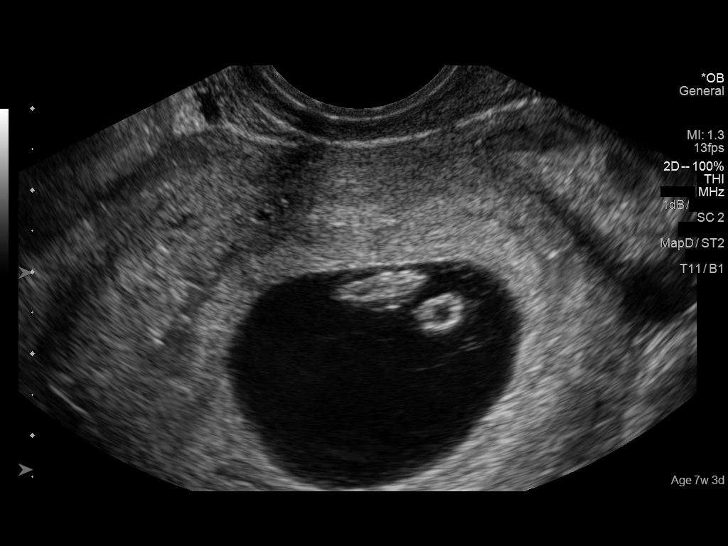
[im 68/108]
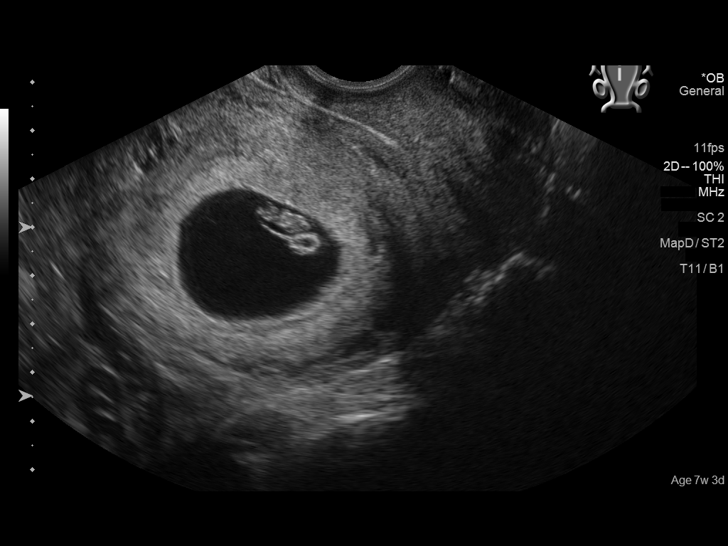
[im 76/108]
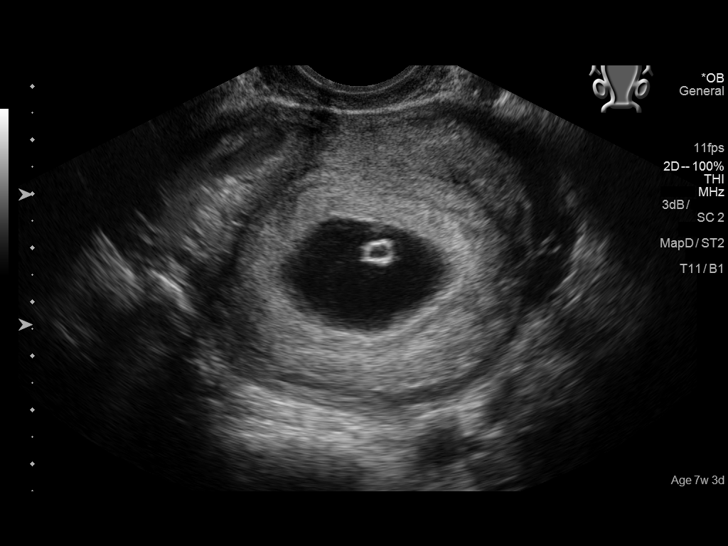
[im 84/108]
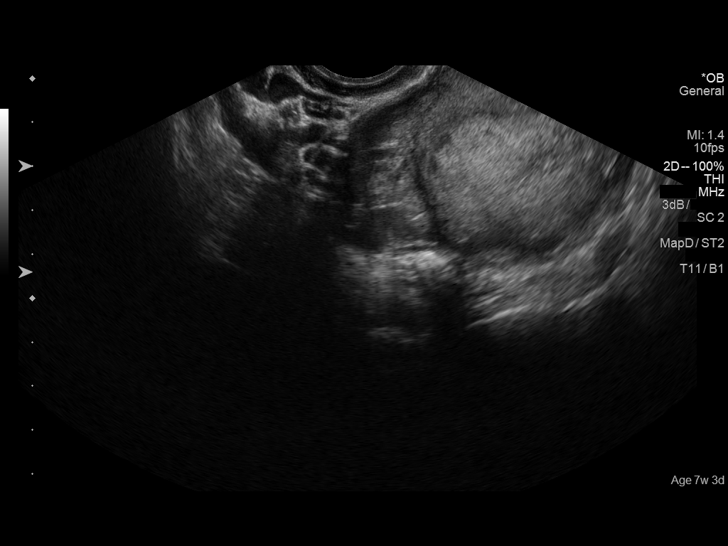
[im 92/108]
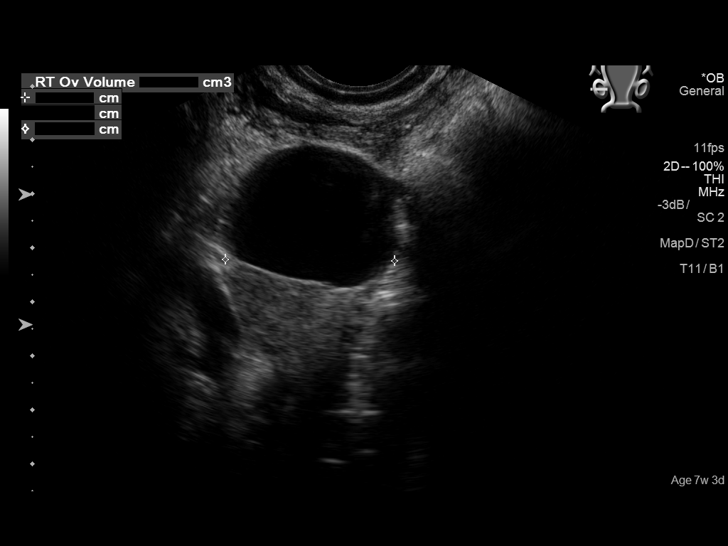
[im 100/108]
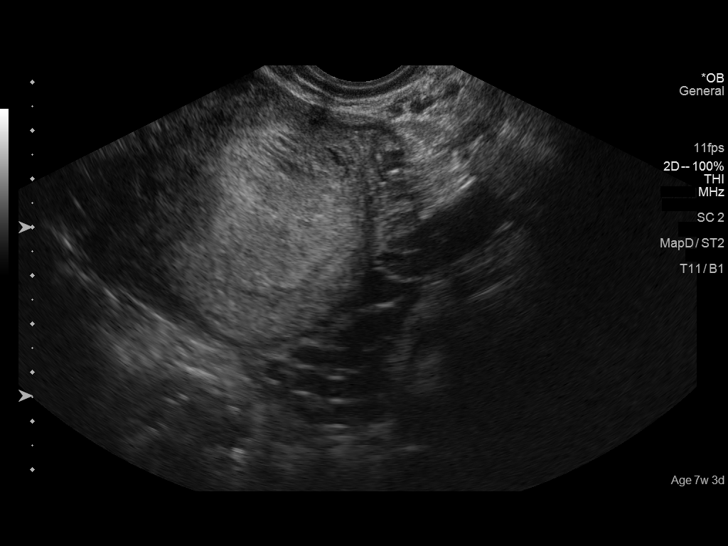
[im 108/108]
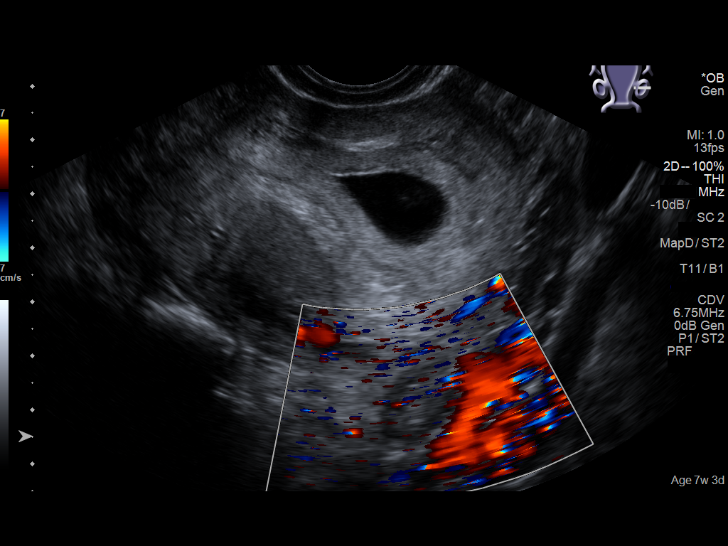

[14 of 28 positions shown; findings below may reference images not displayed]

FINDINGS: Intrauterine gestational sac: Single

Yolk sac:  Visualized.

Embryo:  Visualized.

Cardiac Activity: Visualized.

Heart Rate: 133  bpm

CRL:  11  mm   7 w   2 d                  US EDC: 08/30/2017

Subchorionic hemorrhage:  None visualized.

Maternal uterus/adnexae: Cyst in the right ovary measures 3.3 cm,
there is normal ovarian blood flow. The left ovary is normal with
blood flow. No pelvic free fluid.
IMPRESSION: Single live intrauterine pregnancy estimated gestational age 7 weeks
2 days for estimated date of delivery 08/30/2017. No subchorionic
hemorrhage.

A 3.3 cm right ovarian cyst may be a corpus luteum or physiologic.

## 2019-01-27 ENCOUNTER — Encounter: Payer: Self-pay | Admitting: *Deleted

## 2019-01-27 ENCOUNTER — Other Ambulatory Visit: Payer: Self-pay

## 2019-01-27 ENCOUNTER — Encounter: Payer: Self-pay | Attending: Family Medicine | Admitting: *Deleted

## 2019-01-27 VITALS — BP 90/66 | Ht 61.0 in | Wt 165.5 lb

## 2019-01-27 DIAGNOSIS — Z3A00 Weeks of gestation of pregnancy not specified: Secondary | ICD-10-CM | POA: Insufficient documentation

## 2019-01-27 DIAGNOSIS — O24419 Gestational diabetes mellitus in pregnancy, unspecified control: Secondary | ICD-10-CM | POA: Insufficient documentation

## 2019-01-27 DIAGNOSIS — O24410 Gestational diabetes mellitus in pregnancy, diet controlled: Secondary | ICD-10-CM

## 2019-01-27 NOTE — Progress Notes (Signed)
Diabetes Self-Management Education  Visit Type: First/Initial  Appt. Start Time: 0915 Appt. End Time: 1045  01/27/2019  Ms. Kristine Blake, identified by name and date of birth, is a 28 y.o. female with a diagnosis of Diabetes: Gestational Diabetes.   ASSESSMENT  Blood pressure 90/66, height 5\' 1"  (1.549 m), weight 165 lb 8 oz (75.1 kg), last menstrual period 06/16/2018, unknown if currently breastfeeding. Body mass index is 31.27 kg/m.  Diabetes Self-Management Education - 01/27/19 1143      Visit Information   Visit Type  First/Initial      Initial Visit   Diabetes Type  Gestational Diabetes    Are you currently following a meal plan?  Yes    What type of meal plan do you follow?  "stopped eating sweet things"    Are you taking your medications as prescribed?  Yes    Date Diagnosed  1 month ago      Health Coping   How would you rate your overall health?  Good      Psychosocial Assessment   Patient Belief/Attitude about Diabetes  Other (comment)   "worried about the baby"   Self-care barriers  English as a second language    Self-management support  Doctor's office;Family    Other persons present  Interpreter    Patient Concerns  Nutrition/Meal planning;Glycemic Control    Special Needs  Other (comment)   Materials in Spanish   Preferred Learning Style  Auditory    Learning Readiness  Ready    How often do you need to have someone help you when you read instructions, pamphlets, or other written materials from your doctor or pharmacy?  1 - Never   if written in Spanish   What is the last grade level you completed in school?  10th      Pre-Education Assessment   Patient understands the diabetes disease and treatment process.  Needs Instruction    Patient understands incorporating nutritional management into lifestyle.  Needs Instruction    Patient undertands incorporating physical activity into lifestyle.  Needs Instruction    Patient understands using  medications safely.  Needs Instruction    Patient understands monitoring blood glucose, interpreting and using results  Needs Instruction    Patient understands prevention, detection, and treatment of acute complications.  Needs Instruction    Patient understands prevention, detection, and treatment of chronic complications.  Needs Instruction    Patient understands how to develop strategies to address psychosocial issues.  Needs Instruction    Patient understands how to develop strategies to promote health/change behavior.  Needs Instruction      Complications   How often do you check your blood sugar?  0 times/day (not testing)   Provided Accu-Chek Guide Me meter and instructed on use. BG upon return demonstration was 95 mg/dL at 16:1010:30 am - fasting.    Have you had a dilated eye exam in the past 12 months?  No    Have you had a dental exam in the past 12 months?  No    Are you checking your feet?  Yes    How many days per week are you checking your feet?  3      Dietary Intake   Breakfast  eggs, tortilla or toast    Lunch  chicken or fish, rice, salad - lettuce, tomato, carrots    Snack (afternoon)  1 pm - fruit (banana, mango, apple)    Dinner  cheese, beans, tortilla, potatoes, corn,  rice, broccoli, cauliflower, okra, squash    Snack (evening)  6 pm snack- oatmeal and 10 pm snack - fruit    Beverage(s)  water, milk, juice, coffee with sugar, diet soda      Exercise   Exercise Type  Light (walking / raking leaves)    How many days per week to you exercise?  2    How many minutes per day do you exercise?  20    Total minutes per week of exercise  40      Patient Education   Previous Diabetes Education  Yes (please comment)   4 years ago (here) for gestational diabetes   Disease state   Definition of diabetes, type 1 and 2, and the diagnosis of diabetes;Factors that contribute to the development of diabetes    Nutrition management   Role of diet in the treatment of diabetes and the  relationship between the three main macronutrients and blood glucose level;Reviewed blood glucose goals for pre and post meals and how to evaluate the patients' food intake on their blood glucose level.    Physical activity and exercise   Role of exercise on diabetes management, blood pressure control and cardiac health.    Monitoring  Taught/evaluated SMBG meter.;Purpose and frequency of SMBG.;Taught/discussed recording of test results and interpretation of SMBG.;Ketone testing, when, how.    Chronic complications  Relationship between chronic complications and blood glucose control    Preconception care  Pregnancy and GDM  Role of pre-pregnancy blood glucose control on the development of the fetus;Role of family planning for patients with diabetes;Reviewed with patient blood glucose goals with pregnancy      Individualized Goals (developed by patient)   Reducing Risk  Improve blood sugars     Outcomes   Expected Outcomes  Demonstrated interest in learning. Expect positive outcomes       Individualized Plan for Diabetes Self-Management Training:   Learning Objective:  Patient will have a greater understanding of diabetes self-management. Patient education plan is to attend individual and/or group sessions per assessed needs and concerns.   Plan:   Patient Instructions  Read booklet on Gestational Diabetes Follow Gestational Meal Planning Guidelines Avoid sugar sweetened drinks (juice, coffee) Limit use of artificial sweeteners (diet sodas) Complete a 3 Day Food Record and bring to next appointment Check blood sugars 4 x day - before breakfast and 2 hrs after every meal and record  Bring blood sugar log to all appointments Call MD for prescription for meter strips and lancets Strips  Accu-Chek Guide Lancets   Accu-Chek FastClix Purchase urine ketone strips if ordered by MD and check urine ketones every am:  If + increase bedtime snack to 1 protein and 2 carbohydrate servings Walk  20-30 minutes at least 5 x week if permitted by MD  Expected Outcomes:  Demonstrated interest in learning. Expect positive outcomes  Education material provided: (Spanish) Gestational Booklet Gestational Meal Planning Guidelines Simple Meal Plan Meter = Accu-Chek Guide ME 3 Day Food Record Goals for a Healthy Pregnancy  If problems or questions, patient to contact team via:   Sharion Settler, RN, CCM, CDE [phone]  Future DSME appointment:  February 03, 2019 with the dietitian

## 2019-01-27 NOTE — Patient Instructions (Signed)
Read booklet on Gestational Diabetes Follow Gestational Meal Planning Guidelines Avoid sugar sweetened drinks (juice, coffee) Limit use of artificial sweeteners (diet sodas) Complete a 3 Day Food Record and bring to next appointment Check blood sugars 4 x day - before breakfast and 2 hrs after every meal and record  Bring blood sugar log to all appointments Call MD for prescription for meter strips and lancets Strips  Accu-Chek Guide Lancets   Accu-Chek FastClix Purchase urine ketone strips if ordered by MD and check urine ketones every am:  If + increase bedtime snack to 1 protein and 2 carbohydrate servings Walk 20-30 minutes at least 5 x week if permitted by MD

## 2019-02-03 ENCOUNTER — Ambulatory Visit: Payer: Self-pay | Admitting: Dietician

## 2019-02-03 ENCOUNTER — Encounter: Payer: Self-pay | Admitting: Dietician

## 2019-03-03 ENCOUNTER — Other Ambulatory Visit: Payer: Self-pay

## 2019-03-03 ENCOUNTER — Encounter: Payer: Self-pay | Admitting: Nurse Practitioner

## 2019-03-03 ENCOUNTER — Ambulatory Visit: Payer: Self-pay | Admitting: Nurse Practitioner

## 2019-03-03 VITALS — BP 98/67 | Temp 97.4°F | Wt 167.4 lb

## 2019-03-03 DIAGNOSIS — Z3483 Encounter for supervision of other normal pregnancy, third trimester: Secondary | ICD-10-CM

## 2019-03-03 DIAGNOSIS — A6000 Herpesviral infection of urogenital system, unspecified: Secondary | ICD-10-CM

## 2019-03-03 DIAGNOSIS — B009 Herpesviral infection, unspecified: Secondary | ICD-10-CM | POA: Insufficient documentation

## 2019-03-03 LAB — URINALYSIS
Bilirubin, UA: NEGATIVE
Glucose, UA: NEGATIVE
Ketones, UA: NEGATIVE
Nitrite, UA: NEGATIVE
Protein,UA: NEGATIVE
RBC, UA: NEGATIVE
Specific Gravity, UA: 1.02 (ref 1.005–1.030)
Urobilinogen, Ur: 0.2 mg/dL (ref 0.2–1.0)
pH, UA: 6 (ref 5.0–7.5)

## 2019-03-03 NOTE — Progress Notes (Addendum)
Subjective:     Patient ID: Kristine Blake, female   DOB: 04/01/1991, 28 y.o.   MRN: [id number]  HPI  Here for 36 wk labs await results - self collected PHQ9 score - 1 - no further action required at this time.   Review of Systems     Objective:   Physical Exam     Assessment:        BS log - for past 2 weeks (from 02/16/2019 - 03/02/2019 included the following:  Fasting range - 15 readings total - ranging from - 81-95 - 0 readings above 95 2 hrs PP breakfast - 15 readings total - ranging from - 99-130 - 1 reading above 120 2 hrs PP lunch - 14 readings total - ranging from - 99-130 - 3 readings above 120 2 hrs PP dinner - 12 readings total - ranging from - 101-130 - 7 readings above 120  Encouraged client to continue with daily BS readings and advised to bring log to next appointment     PRENATAL VISIT NOTE  Subjective:  Kristine Blake is a 28 y.o. [phone] at 5961w0d being seen today for ongoing prenatal care.  She is currently monitored for the following issues for this low-risk pregnancy and has Herpes; Pregnancy, supervision, high-risk; GDM (gestational diabetes mellitus)   12/2018; History of postpartum hemorrhage  (2019); First trimester bleeding; Obesity, unspecified; Type A blood, Rh positive; and Obesity affecting pregnancy, antepartum on their problem list.  Patient reports no complaints.  Contractions: Not present. Vag. Bleeding: None.  Movement: Present. denies vomiting, abdominal pain, fussiness, diarrhea, cough and difficulty breathing leaking of fluid/ROM.   The following portions of the patient's history were reviewed and updated as appropriate: allergies, current medications, past family history, past medical history, past social history, past surgical history and problem list. Problem list updated.  Objective:   Vitals:   03/03/19 0828  BP: 98/67  Temp: (!) 97.4 F (36.3 C)  Weight: 167 lb 6.4 oz (75.9 kg)    Fetal Status: Fetal Heart Rate  (bpm): 144 Fundal Height: 36 cm Movement: Present  Presentation: Vertex  General:  Alert, oriented and cooperative. Patient is in no acute distress.  Skin: Skin is warm and dry. No rash noted.   Cardiovascular: Normal heart rate noted  Respiratory: Normal respiratory effort, no problems with respiration noted  Abdomen: Soft, gravid, appropriate for gestational age.  Pain/Pressure: Absent     Pelvic: Cervical exam deferred        Extremities: Normal range of motion.  Edema: None  Mental Status: Normal mood and affect. Normal behavior. Normal judgment and thought content.   Assessment and Plan:  Pregnancy: [id number] at 5361w0d  1. Genital herpes simplex, unspecified site Client started on Acyclovir 400 mg tabs - take 1 tab po BID during remaining pregnancy, #240, RF#0 Discussed importance of taking and prevent HSV outbreak during end of pregnancy   Preterm labor symptoms and general obstetric precautions including but not limited to vaginal bleeding, contractions, leaking of fluid and fetal movement were reviewed in detail with the patient. Please refer to After Visit Summary for other counseling recommendations.  Return in about 1 week (around 03/10/2019) for routine prenatal care.  Future Appointments  Date Time Provider Department Center  03/20/2019 10:40 AM [facility] PROVIDER [facility] None    Donn PieriniKarla W Parys Elenbaas, NP  Plan:     Plan to follow up in 1 wk - in-house visit Client verbalizes understanding and is in agreement with  plan of care

## 2019-03-03 NOTE — Progress Notes (Signed)
36 wk. Cultures & packets given Debera Lat

## 2019-03-04 ENCOUNTER — Encounter: Payer: Self-pay | Admitting: Nurse Practitioner

## 2019-03-04 MED ORDER — ACYCLOVIR 400 MG PO TABS: 400.0000 mg | ORAL_TABLET | Freq: Two times a day (BID) | ORAL | Status: DC

## 2019-03-04 NOTE — Patient Instructions (Signed)
Herpes genital Genital Herpes El herpes genital es una infeccin de transmisin sexual (ITS) frecuente causada por un virus. El virus se propaga de Neomia Dearuna persona a otra a travs del contacto sexual. La infeccin puede causar picazn, ampollas y llagas en los genitales o en el recto. Los sntomas pueden durar [address]varios das y Clinical biochemistluego desaparecer. Esto se llama erupcin. Sin embargo, el virus NVR Incpermanece en el cuerpo, de modo que es posible que tenga ms erupciones en el futuro. El Bank of Americatiempo entre las erupciones vara: pueden transcurrir meses o aos. El herpes genital afecta a hombres y mujeres. Es particularmente preocupante para las embarazadas porque el virus puede transmitirse al beb durante el parto y causar problemas graves. El herpes genital tambin es un motivo de preocupacin para las personas que tienen debilitado el sistema encargado de combatir las enfermedades (sistema inmunitario). Cules son las causas? La causa de esta afeccin es el virus del herpes simple (VHS) tipo1 o tipo2. El virus puede transmitirse a travs de lo siguiente:  El contacto sexual con una persona infectada, incluido el sexo vaginal, anal y oral.  El contacto con el lquido de una llaga de herpes.  La piel. Esto significa que puede contagiarse el herpes de una pareja infectada incluso si la persona no tiene llagas visibles o no sabe que est infectada. Qu incrementa el riesgo? Es ms probable que desarrolle esta afeccin si:  Tiene relaciones sexuales con muchas parejas.  No Bots[place]ausa preservativos de ltex cuando tiene Clinical research associaterelaciones sexuales. Cules son los signos o los sntomas? La Harley-Davidsonmayora de las personas no presentan sntomas (casos asintomticos) o tienen sntomas leves que pueden confundir con otros problemas de la piel. Entre los sntomas se pueden incluir los siguientes:  Pequeos bultos (protuberancias) rojos cerca de los genitales, del recto o de la boca. Estos bultos se convierten en ampollas y Clinical cytogeneticistluego en llagas.   Sntomas similares a los de la gripe, como: ? Grant RutsFiebre. ? Dolores Temple-Inlanden el cuerpo. ? Ganglios linfticos hinchados. ? Dolor de Turkmenistancabeza.  Dolor al Beatrix Shipperorinar.  Dolor y picazn en la zona genital o en el rea rectal.  Secrecin vaginal.  Hormigueo o dolor punzante en las piernas y en las nalgas. Por lo general, los sntomas son ms intensos y duran ms durante la primera erupcin (primaria). Los sntomas similares a los de la gripe tambin son ms frecuentes durante la erupcin primaria. Cmo se diagnostica? El herpes genital se puede diagnosticar en funcin de lo siguiente:  Un examen fsico.  Sus antecedentes mdicos.  Anlisis de Shawmutsangre.  Anlisis de Colombiauna muestra del lquido (cultivo) de Heritage manageruna llaga abierta. Cmo se trata? No existe ninguna cura para esta afeccin, pero el tratamiento con medicamentos antivirales que se toman por la boca (por va oral) puede lograr lo siguiente:  Camera operatorAcelerar la recuperacin y Eastman Kodakaliviar los sntomas.  Ayudar a reducir Interior and spatial designerel contagio del virus a las Chief Strategy Officerparejas sexuales.  Limitar las probabilidades de futuras erupciones o reducir su duracin.  Aliviar los sntomas de futuras erupciones. El mdico tambin puede recomendarle medicamentos para Engineer, materialsaliviar el dolor (analgsicos), como aspirina o ibuprofeno. Siga estas instrucciones en su casa: Actividad sexual  No tenga contacto sexual durante erupciones activas.  Practique el sexo seguro. Los preservativos de ltex y los preservativos femeninos pueden ayudar a Multimedia programmerevitar el contagio del virus del herpes. Instrucciones generales  Mantenga las zonas afectadas secas y limpias.  Tome los medicamentos de venta libre y los recetados solamente como se lo haya indicado el mdico.  Evite frotar o tocar las  ampollas y las llagas. Si toca las ampollas o llagas: ? Lvese bien las manos con agua y [place]. ? No se toque los ojos despus de ello.  Para aliviar el dolor o la picazn, puede tomar las siguientes medidas segn las  indicaciones del mdico: ? Aplique un pao hmedo y fro (compresa fra) en las zonas afectadas de 4 a 6veces por Training and development officer. ? Aplique una sustancia que protege la piel y reduce el sangrado (astringente). ? Aplique un gel que ayuda a Best boy cerca de las llagas (gel con lidocana). ? Dese un bao tibio de poca profundidad para limpiar la zona genital (bao de asiento).  Concurra a todas las visitas de control como se lo haya indicado el mdico. Esto es importante. Cmo se evita?  Use preservativos. Aunque cualquier persona puede contraer herpes genital durante el contacto sexual, incluso con el uso de un preservativo, el preservativo puede brindar cierta proteccin.  Evite tener mltiples parejas sexuales.  Hable con su pareja sexual acerca de los sntomas que cualquiera de los dos pudiera tener. Adems, hable con su pareja acerca de cualquier antecedente de ITS que pudiera tener.  Hgase pruebas para detectar ITS antes de Clinical biochemist. Pdale a su pareja que haga lo mismo.  No tenga contacto sexual si tiene sntomas de herpes genital. Comunquese con un mdico si:  Los sntomas no mejoran con los medicamentos.  Los sntomas vuelven a Arts administrator.  Aparecen nuevos sntomas.  Tiene fiebre.  Siente dolor abdominal.  Presenta enrojecimiento, hinchazn o dolor en el ojo.  Nota llagas nuevas en otras partes del cuerpo.  Es Art therapist y presenta sangrado entre perodos [place].  Tuvo herpes y [place] o est pensando en quedar embarazada. Resumen  El herpes genital es una infeccin de transmisin sexual (ITS) frecuente causada por el virus del herpes simple (VHS) tipo1 o tipo2.  Estos virus casi siempre se transmiten a travs del contacto sexual con Ardelia Mems persona infectada.  Es ms probable que Technical sales engineer afeccin si tiene relaciones sexuales con muchas parejas o si tuvo relaciones sexuales sin proteccin.  La [employer] de las personas no presentan  sntomas (casos asintomticos) o tienen sntomas leves que pueden confundir con otros problemas de la piel. Los sntomas aparecen a medida que se presentan las erupciones con meses o aos de separacin entre Ardelia Mems y [place].  No existe ninguna cura para esta afeccin, pero el tratamiento con medicamentos antivirales por va oral puede [employer], reducir las probabilidades de Hydrologist el virus a una pareja, evitar futuras erupciones o disminuir la duracin de futuras erupciones. Esta informacin no tiene Marine scientist el consejo del mdico. Asegrese de hacerle al mdico cualquier pregunta que tenga. Document Released: 05/24/2005 Document Revised: 11/17/2016 Document Reviewed: 11/17/2016 Elsevier Patient Education  2020 [employer].

## 2019-03-05 ENCOUNTER — Encounter: Payer: Self-pay | Admitting: Dietician

## 2019-03-05 LAB — CHLAMYDIA/GC NAA, CONFIRMATION
Chlamydia trachomatis, NAA: NEGATIVE
Neisseria gonorrhoeae, NAA: NEGATIVE

## 2019-03-05 LAB — STREP GP B NAA: Strep Gp B NAA: NEGATIVE

## 2019-03-05 NOTE — Progress Notes (Signed)
Have not heard back from patient to reschedule her missed appointment. Sent letter to referring provider. 

## 2019-03-05 NOTE — Progress Notes (Signed)
Patient did not come for her RD visit scheduled for 02/03/19. [facility] interpreter called patient and left a voicemail message to reschedule.

## 2019-03-06 DIAGNOSIS — O0990 Supervision of high risk pregnancy, unspecified, unspecified trimester: Secondary | ICD-10-CM | POA: Insufficient documentation

## 2019-03-06 DIAGNOSIS — Z6710 Type A blood, Rh positive: Secondary | ICD-10-CM | POA: Insufficient documentation

## 2019-03-06 DIAGNOSIS — O24419 Gestational diabetes mellitus in pregnancy, unspecified control: Secondary | ICD-10-CM | POA: Insufficient documentation

## 2019-03-06 DIAGNOSIS — E663 Overweight: Secondary | ICD-10-CM | POA: Insufficient documentation

## 2019-03-06 DIAGNOSIS — Z8759 Personal history of other complications of pregnancy, childbirth and the puerperium: Secondary | ICD-10-CM | POA: Insufficient documentation

## 2019-03-06 DIAGNOSIS — O209 Hemorrhage in early pregnancy, unspecified: Secondary | ICD-10-CM | POA: Insufficient documentation

## 2019-03-06 HISTORY — DX: Type A blood, Rh positive: Z67.10

## 2019-03-08 NOTE — Progress Notes (Signed)
Labs abstracted to chart.

## 2019-03-10 ENCOUNTER — Ambulatory Visit: Payer: Self-pay | Admitting: Nurse Practitioner

## 2019-03-10 ENCOUNTER — Other Ambulatory Visit: Payer: Self-pay

## 2019-03-10 ENCOUNTER — Other Ambulatory Visit: Payer: Self-pay | Admitting: Family Medicine

## 2019-03-10 VITALS — BP 95/66 | Temp 97.4°F | Wt 167.4 lb

## 2019-03-10 DIAGNOSIS — O24410 Gestational diabetes mellitus in pregnancy, diet controlled: Secondary | ICD-10-CM

## 2019-03-10 DIAGNOSIS — B009 Herpesviral infection, unspecified: Secondary | ICD-10-CM

## 2019-03-10 LAB — URINALYSIS
Bilirubin, UA: NEGATIVE
Glucose, UA: NEGATIVE
Ketones, UA: NEGATIVE
Nitrite, UA: NEGATIVE
Protein,UA: NEGATIVE
RBC, UA: NEGATIVE
Specific Gravity, UA: 1.015 (ref 1.005–1.030)
Urobilinogen, Ur: 0.2 mg/dL (ref 0.2–1.0)
pH, UA: 6 (ref 5.0–7.5)

## 2019-03-10 MED ORDER — GLYBURIDE 1.25 MG PO TABS: 2.5000 mg | ORAL_TABLET | Freq: Every day | ORAL | Status: DC

## 2019-03-10 NOTE — Progress Notes (Signed)
Denies covid sx's or risks. Denies patient/partner travel. Aileen Fass, RN  U/A Dip reviewed with provider.Aileen Fass, RN

## 2019-03-10 NOTE — Progress Notes (Addendum)
   Nursing Staff Provider  Office Location  ACHD Dating    Language  Spanish Anatomy US    Flu Vaccine   Genetic Screen  NIPS:   AFP:   First Screen:  Quad:    TDaP vaccine    Hgb A1C or  GTT Early  Third trimester   Rhogam     LAB RESULTS   Feeding Plan  Blood Type --/--/A positive (12/18 0000)   Contraception  Antibody Negative (12/18 0000)  Circumcision  Rubella    Pediatrician   RPR Nonreactive (05/07 0000)   Support Person  HBsAg Negative (12/18 0000)   Prenatal Classes  HIV Non-reactive (05/07 0000)  BTL Consent  GBS Negative (07/06 0930)(For PCN allergy, check sensitivities)   VBAC Consent  Pap      Hgb Electro      CF   Delivery Group   SMA   Centering Group        Subjective:  Kristine Blake is a 28 y.o. female being seen today for continuation of prenatal care. The patient reports they do not have symptoms.  Patient has the following medical conditionshas Labor and delivery, indication for care; Indication for care or intervention related to labor and delivery; Herpes; Pregnancy, supervision, high-risk; GDM (gestational diabetes mellitus)   12/2018; History of postpartum hemorrhage  (2019); First trimester bleeding; Obesity, unspecified; and Type A blood, Rh positive on their problem list.  Chief Complaint  Patient presents with  . Routine Prenatal Visit    [redacted] wks gestation, BS log reviewed    Patient reports  HPI   See flowsheet for further details and programmatic requirements.    The following portions of the patient's history were reviewed and updated as appropriate: allergies, current medications, past family history, past medical history, past social history, past surgical history and problem list. Problem list updated.  Objective:   Vitals:   03/10/19 0900  BP: 95/66  Temp: (!) 97.4 F (36.3 C)  Weight: 167 lb 6.4 oz (75.9 kg)    Physical Exam    Assessment and Plan:  Kristine Blake is a 28 y.o. female presenting to the  [facility] Department for continuation of prenatal care.  1. Diet controlled gestational diabetes mellitus (GDM) in third trimester Reviewed BS log 2 hr PP dinner results > 50% over recommended 120 reading Glyburide 2.5 mg tab - called into pharmacy on file - spoke with pharmacist "Ronalee Belts" Client advised to take 1 tab po after lunch time Continue with BS log and bring to next appointment Client verbalizes understanding and is in agreement with plan of care   2. Herpes Client admits to taking medication daily Continue to monitor       Return for 1 week.  Future Appointments  Date Time Provider [place]  03/17/2019  8:40 AM [facility] PROVIDER [facility] None    Berniece Andreas, NP

## 2019-03-10 NOTE — Progress Notes (Signed)
    STI clinic/screening visit  Subjective:  Kristine Blake is a 28 y.o. female being seen today for an STI screening visit. The patient reports they do not have symptoms.  Patient has the following medical conditionshas Labor and delivery, indication for care; Indication for care or intervention related to labor and delivery; Herpes; Pregnancy, supervision, high-risk; GDM (gestational diabetes mellitus)   12/2018; History of postpartum hemorrhage  (2019); First trimester bleeding; Obesity, unspecified; and Type A blood, Rh positive on their problem list.  Chief Complaint  Patient presents with  . Routine Prenatal Visit    [redacted] wks gestation, BS log reviewed    Patient reports  HPI   See flowsheet for further details and programmatic requirements.    The following portions of the patient's history were reviewed and updated as appropriate: allergies, current medications, past family history, past medical history, past social history, past surgical history and problem list. Problem list updated.  Objective:   Vitals:   03/10/19 0900  BP: 95/66  Temp: (!) 97.4 F (36.3 C)  Weight: 167 lb 6.4 oz (75.9 kg)    Physical Exam    Assessment and Plan:  [name] is a 29 y.o. female presenting to the [facility] Department for STI screening  There are no diagnoses linked to this encounter.    Return for 1 week.  Future Appointments  Date Time Provider [place]  03/17/2019  8:40 AM [facility] PROVIDER [facility] None    Berniece Andreas, NP

## 2019-03-10 NOTE — Patient Instructions (Addendum)
Instructed to pick up Glyburide 2.5 mg tab - take 1 tab po q daily at 12 . #30, RF#0. Rx from [facility] this afternoon. Patient to RTC in 1 week. Aileen Fass, RN

## 2019-03-11 ENCOUNTER — Ambulatory Visit: Payer: Self-pay

## 2019-03-17 ENCOUNTER — Other Ambulatory Visit: Payer: Self-pay

## 2019-03-17 ENCOUNTER — Ambulatory Visit: Payer: Self-pay | Admitting: Family Medicine

## 2019-03-17 VITALS — BP 98/63 | Temp 97.4°F | Wt 169.6 lb

## 2019-03-17 DIAGNOSIS — O24415 Gestational diabetes mellitus in pregnancy, controlled by oral hypoglycemic drugs: Secondary | ICD-10-CM

## 2019-03-17 DIAGNOSIS — E6609 Other obesity due to excess calories: Secondary | ICD-10-CM

## 2019-03-17 DIAGNOSIS — B009 Herpesviral infection, unspecified: Secondary | ICD-10-CM

## 2019-03-17 DIAGNOSIS — Z8759 Personal history of other complications of pregnancy, childbirth and the puerperium: Secondary | ICD-10-CM

## 2019-03-17 DIAGNOSIS — Z6835 Body mass index (BMI) 35.0-35.9, adult: Secondary | ICD-10-CM

## 2019-03-17 DIAGNOSIS — O99210 Obesity complicating pregnancy, unspecified trimester: Secondary | ICD-10-CM | POA: Insufficient documentation

## 2019-03-17 DIAGNOSIS — Z862 Personal history of diseases of the blood and blood-forming organs and certain disorders involving the immune mechanism: Secondary | ICD-10-CM

## 2019-03-17 DIAGNOSIS — O0993 Supervision of high risk pregnancy, unspecified, third trimester: Secondary | ICD-10-CM

## 2019-03-17 LAB — URINALYSIS
Bilirubin, UA: NEGATIVE
Glucose, UA: NEGATIVE
Ketones, UA: NEGATIVE
Nitrite, UA: NEGATIVE
Protein,UA: NEGATIVE
Specific Gravity, UA: 1.02 (ref 1.005–1.030)
Urobilinogen, Ur: 0.2 mg/dL (ref 0.2–1.0)
pH, UA: 6.5 (ref 5.0–7.5)

## 2019-03-17 LAB — FETAL NONSTRESS TEST

## 2019-03-17 NOTE — Progress Notes (Signed)
In for visit; BS log copied Debera Lat, RN  NST performed=>reactive, per Dr. Ernestina Patches; f/u NST appt. Given for 03/20/19. Debera Lat, RN

## 2019-03-17 NOTE — Progress Notes (Signed)
  PRENATAL VISIT NOTE  Subjective:  Kristine Blake is a 28 y.o. [phone] at [redacted]w[redacted]d being seen today for ongoing prenatal care.  She is currently monitored for the following issues for this high-risk pregnancy and has Herpes; Pregnancy, supervision, high-risk; GDM (gestational diabetes mellitus)   12/2018; History of postpartum hemorrhage  (2019); First trimester bleeding; Obesity, unspecified; Type A blood, Rh positive; and Obesity affecting pregnancy, antepartum on their problem list.  Patient reports no complaints.  Contractions: Irregular. Vag. Bleeding: None.  Movement: Present. Denies leaking of fluid/ROM.   The following portions of the patient's history were reviewed and updated as appropriate: allergies, current medications, past family history, past medical history, past social history, past surgical history and problem list. Problem list updated.  Objective:   Vitals:   03/17/19 0849  BP: 98/63  Temp: (!) 97.4 F (36.3 C)  Weight: 169 lb 9.6 oz (76.9 kg)    Fetal Status: Fetal Heart Rate (bpm): 135 Fundal Height: 39 cm Movement: Present  Presentation: Vertex  General:  Alert, oriented and cooperative. Patient is in no acute distress.  Skin: Skin is warm and dry. No rash noted.   Cardiovascular: Normal heart rate noted  Respiratory: Normal respiratory effort, no problems with respiration noted  Abdomen: Soft, gravid, appropriate for gestational age.  Pain/Pressure: Absent     Pelvic: Cervical exam performed Dilation: 1.5 Effacement (%): 60 Station: -3  Extremities: Normal range of motion.  Edema: Trace  Mental Status: Normal mood and affect. Normal behavior. Normal judgment and thought content.   Assessment and Plan:  Pregnancy: [id number] at [redacted]w[redacted]d  1. Herpes ON Acyclovir, compliant  2. Gestational diabetes mellitus (GDM) in third trimester controlled on oral hypoglycemic drug  Fasting all within range   PP greatly improved since starting glyburide 2.5mg  at lunch.  Currently any above 120 values are in 120-140 range. Only 3 of 14 above goal.   NST done today-- reactive and reassuring.   Discussed recommendation for IOL at 39 wks given need medication to control blood glucose.   Discussed increased risk of stillbirth  Patient voiced understanding but does not want IOL prior to 40 wks.   Discussed risk/benefits of sweeping of membranes performed today  IOL form sent to [facility] -- requesting 7/27 IOL   3. History of postpartum hemorrhage  (2019) LD aware  4. Supervision of high risk pregnancy in third trimester Up to date currently  5. Class 2 obesity due to excess calories without serious comorbidity with body mass index (BMI) of 35.0 to 35.9 in adult  6. Obesity affecting pregnancy, antepartum Weight gain above goal but not greatly.    Term labor symptoms and general obstetric precautions including but not limited to vaginal bleeding, contractions, leaking of fluid and fetal movement were reviewed in detail with the patient. Please refer to After Visit Summary for other counseling recommendations.  Return in about 3 days (around 03/20/2019) for NST.  No future appointments.  Caren Macadam, MD

## 2019-03-19 MED ORDER — ACYCLOVIR 400 MG PO TABS
400.0000 mg | ORAL_TABLET | Freq: Every day | ORAL | 0 refills | Status: DC
Start: 2019-03-19 — End: 2022-03-27

## 2019-03-19 NOTE — Addendum Note (Signed)
Addended by: Jerline Pain on: 03/19/2019 01:02 PM   Modules accepted: Orders

## 2019-03-20 ENCOUNTER — Other Ambulatory Visit: Payer: Self-pay

## 2019-03-21 ENCOUNTER — Inpatient Hospital Stay
Admission: EM | Admit: 2019-03-21 | Discharge: 2019-03-22 | DRG: 806 | Disposition: A | Discharge status: Home / Self Care | Payer: Medicaid Other | Attending: Obstetrics and Gynecology | Admitting: Obstetrics and Gynecology

## 2019-03-21 ENCOUNTER — Other Ambulatory Visit: Payer: Self-pay

## 2019-03-21 DIAGNOSIS — Z3A39 39 weeks gestation of pregnancy: Secondary | ICD-10-CM | POA: Diagnosis not present

## 2019-03-21 DIAGNOSIS — Z880 Allergy status to penicillin: Secondary | ICD-10-CM

## 2019-03-21 DIAGNOSIS — D62 Acute posthemorrhagic anemia: Secondary | ICD-10-CM | POA: Diagnosis not present

## 2019-03-21 DIAGNOSIS — O26893 Other specified pregnancy related conditions, third trimester: Secondary | ICD-10-CM | POA: Diagnosis present

## 2019-03-21 DIAGNOSIS — B009 Herpesviral infection, unspecified: Secondary | ICD-10-CM

## 2019-03-21 DIAGNOSIS — Z1159 Encounter for screening for other viral diseases: Secondary | ICD-10-CM

## 2019-03-21 DIAGNOSIS — O24425 Gestational diabetes mellitus in childbirth, controlled by oral hypoglycemic drugs: Principal | ICD-10-CM | POA: Diagnosis present

## 2019-03-21 DIAGNOSIS — O9081 Anemia of the puerperium: Secondary | ICD-10-CM | POA: Diagnosis not present

## 2019-03-21 DIAGNOSIS — O9832 Other infections with a predominantly sexual mode of transmission complicating childbirth: Secondary | ICD-10-CM | POA: Diagnosis present

## 2019-03-21 DIAGNOSIS — A6000 Herpesviral infection of urogenital system, unspecified: Secondary | ICD-10-CM | POA: Diagnosis present

## 2019-03-21 DIAGNOSIS — O24419 Gestational diabetes mellitus in pregnancy, unspecified control: Secondary | ICD-10-CM | POA: Diagnosis present

## 2019-03-21 DIAGNOSIS — O99210 Obesity complicating pregnancy, unspecified trimester: Secondary | ICD-10-CM

## 2019-03-21 LAB — CBC
HCT: 39.3 % (ref 36.0–46.0)
HCT: 30 % — ABNORMAL LOW (ref 36.0–46.0)
Hemoglobin: 12.9 g/dL (ref 12.0–15.0)
Hemoglobin: 9.8 g/dL — ABNORMAL LOW (ref 12.0–15.0)
MCH: 26.3 pg (ref 26.0–34.0)
MCH: 26.5 pg (ref 26.0–34.0)
MCHC: 32.8 g/dL (ref 30.0–36.0)
MCHC: 32.7 g/dL (ref 30.0–36.0)
MCV: 80.2 fL (ref 80.0–100.0)
MCV: 81.1 fL (ref 80.0–100.0)
Platelets: 283 10*3/uL (ref 150–400)
Platelets: 230 10*3/uL (ref 150–400)
RBC: 4.9 MIL/uL (ref 3.87–5.11)
RBC: 3.7 MIL/uL — ABNORMAL LOW (ref 3.87–5.11)
RDW: 14.1 % (ref 11.5–15.5)
RDW: 14 % (ref 11.5–15.5)
WBC: 12.7 10*3/uL — ABNORMAL HIGH (ref 4.0–10.5)
WBC: 17.8 10*3/uL — ABNORMAL HIGH (ref 4.0–10.5)
nRBC: 0 % (ref 0.0–0.2)
nRBC: 0 % (ref 0.0–0.2)

## 2019-03-21 LAB — TYPE AND SCREEN
ABO/RH(D): A POS
Antibody Screen: NEGATIVE

## 2019-03-21 LAB — GLUCOSE, CAPILLARY: Glucose-Capillary: 113 mg/dL — ABNORMAL HIGH (ref 70–99)

## 2019-03-21 LAB — SARS CORONAVIRUS 2 BY RT PCR (HOSPITAL ORDER, PERFORMED IN ~~LOC~~ HOSPITAL LAB): SARS Coronavirus 2: NEGATIVE

## 2019-03-21 MED ORDER — FERROUS SULFATE 325 (65 FE) MG PO TABS
325.0000 mg | ORAL_TABLET | Freq: Every day | ORAL | Status: DC
  Administered 2019-03-21 – 2019-03-22 (×2): 325 mg via ORAL
  Filled 2019-03-21 (×2): qty 1

## 2019-03-21 MED ORDER — SENNOSIDES-DOCUSATE SODIUM 8.6-50 MG PO TABS: 2.0000 | ORAL_TABLET | ORAL | Status: DC

## 2019-03-21 MED ORDER — OXYTOCIN 10 UNIT/ML IJ SOLN
INTRAMUSCULAR | Status: AC
  Filled 2019-03-21: qty 2

## 2019-03-21 MED ORDER — OXYTOCIN BOLUS FROM INFUSION
500.0000 mL | Freq: Once | INTRAVENOUS | Status: AC
  Administered 2019-03-21: 500 mL via INTRAVENOUS

## 2019-03-21 MED ORDER — IBUPROFEN 600 MG PO TABS
ORAL_TABLET | ORAL | Status: AC
  Filled 2019-03-21: qty 1

## 2019-03-21 MED ORDER — METHYLERGONOVINE MALEATE 0.2 MG PO TABS
0.2000 mg | ORAL_TABLET | Freq: Three times a day (TID) | ORAL | Status: DC
  Administered 2019-03-21 (×2): 0.2 mg via ORAL
  Filled 2019-03-21 (×2): qty 1

## 2019-03-21 MED ORDER — LACTATED RINGERS IV SOLN: 500.0000 mL | INTRAVENOUS | Status: DC | PRN

## 2019-03-21 MED ORDER — ONDANSETRON HCL 4 MG/2ML IJ SOLN: 4.0000 mg | INTRAMUSCULAR | Status: DC | PRN

## 2019-03-21 MED ORDER — LACTATED RINGERS IV SOLN
INTRAVENOUS | Status: DC
  Administered 2019-03-21: 02:00:00 via INTRAVENOUS

## 2019-03-21 MED ORDER — OXYTOCIN 40 UNITS IN NORMAL SALINE INFUSION - SIMPLE MED
2.5000 [IU]/h | INTRAVENOUS | Status: DC
  Administered 2019-03-21: 2.5 [IU]/h via INTRAVENOUS
  Filled 2019-03-21: qty 1000

## 2019-03-21 MED ORDER — AMMONIA AROMATIC IN INHA
RESPIRATORY_TRACT | Status: AC
  Filled 2019-03-21: qty 10

## 2019-03-21 MED ORDER — PRENATAL MULTIVITAMIN CH
1.0000 | ORAL_TABLET | Freq: Every day | ORAL | Status: DC
  Administered 2019-03-21 – 2019-03-22 (×2): 1 via ORAL
  Filled 2019-03-21 (×2): qty 1

## 2019-03-21 MED ORDER — ACETAMINOPHEN 325 MG PO TABS: 650.0000 mg | ORAL_TABLET | ORAL | Status: DC | PRN

## 2019-03-21 MED ORDER — OXYCODONE-ACETAMINOPHEN 5-325 MG PO TABS
ORAL_TABLET | ORAL | Status: AC
  Filled 2019-03-21: qty 1

## 2019-03-21 MED ORDER — WITCH HAZEL-GLYCERIN EX PADS
1.0000 "application " | MEDICATED_PAD | CUTANEOUS | Status: DC | PRN
  Administered 2019-03-21: 1 via TOPICAL

## 2019-03-21 MED ORDER — COCONUT OIL OIL: 1.0000 "application " | TOPICAL_OIL | Status: DC | PRN

## 2019-03-21 MED ORDER — SENNOSIDES-DOCUSATE SODIUM 8.6-50 MG PO TABS
2.0000 | ORAL_TABLET | ORAL | Status: DC
  Administered 2019-03-22: 2 via ORAL
  Filled 2019-03-21: qty 2

## 2019-03-21 MED ORDER — ONDANSETRON HCL 4 MG PO TABS: 4.0000 mg | ORAL_TABLET | ORAL | Status: DC | PRN

## 2019-03-21 MED ORDER — SODIUM CHLORIDE 0.9% FLUSH
10.0000 mL | Freq: Three times a day (TID) | INTRAVENOUS | Status: DC
  Administered 2019-03-21 (×2): 10 mL via INTRAVENOUS

## 2019-03-21 MED ORDER — DIPHENHYDRAMINE HCL 25 MG PO CAPS: 25.0000 mg | ORAL_CAPSULE | Freq: Four times a day (QID) | ORAL | Status: DC | PRN

## 2019-03-21 MED ORDER — IBUPROFEN 600 MG PO TABS
ORAL_TABLET | ORAL | Status: AC
  Administered 2019-03-21: 600 mg via ORAL
  Filled 2019-03-21: qty 1

## 2019-03-21 MED ORDER — DIBUCAINE (PERIANAL) 1 % EX OINT: 1.0000 "application " | TOPICAL_OINTMENT | CUTANEOUS | Status: DC | PRN

## 2019-03-21 MED ORDER — MISOPROSTOL 200 MCG PO TABS
ORAL_TABLET | ORAL | Status: AC
  Administered 2019-03-21: 800 ug via RECTAL
  Filled 2019-03-21: qty 4

## 2019-03-21 MED ORDER — LIDOCAINE HCL (PF) 1 % IJ SOLN
INTRAMUSCULAR | Status: AC
  Filled 2019-03-21: qty 30

## 2019-03-21 MED ORDER — ONDANSETRON HCL 4 MG/2ML IJ SOLN: 4.0000 mg | Freq: Four times a day (QID) | INTRAMUSCULAR | Status: DC | PRN

## 2019-03-21 MED ORDER — WITCH HAZEL-GLYCERIN EX PADS
MEDICATED_PAD | CUTANEOUS | Status: AC
  Filled 2019-03-21: qty 100

## 2019-03-21 MED ORDER — SOD CITRATE-CITRIC ACID 500-334 MG/5ML PO SOLN: 30.0000 mL | ORAL | Status: DC | PRN

## 2019-03-21 MED ORDER — OXYCODONE-ACETAMINOPHEN 5-325 MG PO TABS
1.0000 | ORAL_TABLET | ORAL | Status: DC | PRN
  Administered 2019-03-21 (×3): 1 via ORAL
  Filled 2019-03-21 (×2): qty 1

## 2019-03-21 MED ORDER — IBUPROFEN 600 MG PO TABS
600.0000 mg | ORAL_TABLET | Freq: Four times a day (QID) | ORAL | Status: DC
  Administered 2019-03-21 – 2019-03-22 (×6): 600 mg via ORAL
  Filled 2019-03-21 (×5): qty 1

## 2019-03-21 MED ORDER — BENZOCAINE-MENTHOL 20-0.5 % EX AERO
1.0000 "application " | INHALATION_SPRAY | CUTANEOUS | Status: DC | PRN
  Administered 2019-03-21: 1 via TOPICAL

## 2019-03-21 MED ORDER — LIDOCAINE HCL (PF) 1 % IJ SOLN: 30.0000 mL | INTRAMUSCULAR | Status: DC | PRN

## 2019-03-21 MED ORDER — SIMETHICONE 80 MG PO CHEW: 80.0000 mg | CHEWABLE_TABLET | ORAL | Status: DC | PRN

## 2019-03-21 MED ORDER — BENZOCAINE-MENTHOL 20-0.5 % EX AERO
INHALATION_SPRAY | CUTANEOUS | Status: AC
  Filled 2019-03-21: qty 56

## 2019-03-21 MED ORDER — OXYCODONE-ACETAMINOPHEN 5-325 MG PO TABS: 2.0000 | ORAL_TABLET | ORAL | Status: DC | PRN

## 2019-03-21 MED FILL — Sodium Chloride IV Soln 0.9%: INTRAVENOUS | Qty: 1000 | Status: AC

## 2019-03-21 MED FILL — Oxytocin Inj 10 Unit/ML: INTRAMUSCULAR | Qty: 4 | Status: AC

## 2019-03-21 NOTE — H&P (Addendum)
Obstetric H&P   Chief Complaint: Contractions  Prenatal Care Provider: [facility]  History of Present Illness: 28 y.o. [id number] 6831w4d by 03/24/2019, by Last Menstrual Period presenting to L&D in advanced labor at 9cm.  Patient with gestational diabetes, currently on glyburide 2.5mg  once daily.at lunch.  History of HSV on acylcovir this pregnancy.  Prior pregnancy complicated by postpartum hemorrhage.  +FM, no LOF, no VB.  Lapse of care for 6 weeks in the later part of the pregnancy.  No third trimester growth scans  Pregravid weight141lbs Total Weight Gain 28lbs  Provider    Office Location  [facility] Dating  03/24/2019  Language  Spanish Anatomy US  Normal XX  Flu Vaccine  09/09/2018 Genetic Screen  Declined   TDaP vaccine   01/02/2019 Hgb A1C or  GTT 1-hr 146 with 28 week 3-hr 80 / 176 / 185/ 147  Rhogam  N/A   LAB RESULTS   Feeding Plan Breast/Bottle Blood Type --/--/A positive (12/18 0000)   Contraception Undecided Antibody Negative (12/18 0000)    Rubella  Immune    RPR Nonreactive (05/07 0000)   Support Person Husband Cynthea HBsAg Negative (12/18 0000)   Prenatal Classes  HIV Non-reactive (05/07 0000)  BTL Consent N/A GBS Negative (07/06 0930)(For PCN allergy, check sensitivities)   Anatomy Normal XX Pap    Glucola 1-hr 146 with 28 week 3-hr 80 / 176 / 185/ 147 Hgb Electro  Normal AA  Pediatrician [place] GBS NEGATIVE  Delivery Group  [facility] HSV Positive  Centering Group Traditional Varicella Immune      Review of Systems: 10 point review of systems negative unless otherwise noted in HPI  Past Medical History: Past Medical History:  Diagnosis Date  . Anemia    prior pregnancy  . Dental neglect   . Gestational diabetes   . Heartburn    during pregnancy  . HSV infection   . Varicosities of leg    inner thighs    Past Surgical History: Past Surgical History:  Procedure Laterality Date  . denies surgical hx    . NO PAST SURGERIES      Past Obstetric History: #  1 - Date: 04/20/13, Sex: Female, Weight: 3315 g, GA: 3289w6d, Delivery: Vaginal, Spontaneous, Apgar1: None, Apgar5: None, Living: Living, Birth Comments: None  # 2 - Date: 02/27/15, Sex: Female, Weight: 3266 g, GA: 5758w5d, Delivery: Vaginal, Spontaneous, Apgar1: 8, Apgar5: 9, Living: Living, Birth Comments: none  # 3 - Date: 09/2016, Sex: None, Weight: None, GA: 4563w0d, Delivery: None, Apgar1: None, Apgar5: None, Living: None, Birth Comments: None  # 4 - Date: 08/20/17, Sex: Female, Weight: 3711 g, GA: 3445w5d, Delivery: Vaginal, Spontaneous, Apgar1: None, Apgar5: None, Living: Living, Birth Comments: None  # 5 - Date: None, Sex: None, Weight: None, GA: None, Delivery: None, Apgar1: None, Apgar5: None, Living: None, Birth Comments: None   Past Gynecologic History:  Family History: History reviewed. No pertinent family history.  Social History: Social History   Socioeconomic History  . Marital status: Single    Spouse name: Not on file  . Number of children: Not on file  . Years of education: Not on file  . Highest education level: Not on file  Occupational History  . Not on file  Social Needs  . Financial resource strain: Not on file  . Food insecurity    Worry: Not on file    Inability: Not on file  . Transportation needs    Medical: Not on file  Non-medical: Not on file  Tobacco Use  . Smoking status: Never Smoker  . Smokeless tobacco: Never Used  Substance and Sexual Activity  . Alcohol use: Not Currently  . Drug use: No  . Sexual activity: Yes  Lifestyle  . Physical activity    Days per week: Not on file    Minutes per session: Not on file  . Stress: Not on file  Relationships  . Social Herbalist on phone: Not on file    Gets together: Not on file    Attends religious service: Not on file    Active member of club or organization: Not on file    Attends meetings of clubs or organizations: Not on file    Relationship status: Not on file  . Intimate partner  violence    Fear of current or ex partner: Not on file    Emotionally abused: Not on file    Physically abused: Not on file    Forced sexual activity: Not on file  Other Topics Concern  . Not on file  Social History Narrative  . Not on file    Medications: Prior to Admission medications   Medication Sig Start Date End Date Taking? Authorizing Provider  Prenatal Vit-Fe Fumarate-FA (MULTIVITAMIN-PRENATAL) 27-0.8 MG TABS tablet Take 1 tablet by mouth daily at 12 noon.   Yes [provider]  acyclovir (ZOVIRAX) 400 MG tablet Take 1 tablet (400 mg total) by mouth 2 (two) times daily for 120 doses. 03/04/19 05/03/19  Willaim Sheng, NP  acyclovir (ZOVIRAX) 400 MG tablet Take 1 tablet (400 mg total) by mouth 5 (five) times daily. 03/19/19   Willaim Sheng, NP  glyBURIDE (DIABETA) 2.5 MG tablet Take 2.5 mg by mouth daily after lunch. 03/10/19   [provider]    Allergies: No Known Allergies  Physical Exam: Vitals: Blood pressure 113/84, pulse (!) 103, temperature 98.1 F (36.7 C), temperature source Oral, resp. rate 20, height 5' (1.524 m), weight 75.8 kg, last menstrual period 06/17/2018, unknown if currently breastfeeding.  Urine Dip Protein: N/A  FHT: 140, moderate, +accels, no decels Toco: q1-48min  General: NAD HEENT: normocephalic, anicteric Pulmonary: No increased work of breathing Cardiovascular: RRR, distal pulses 2+ Abdomen: Gravid, non-tender Leopolds: vtx Genitourinary:  Dilation: 9 Station: 0 Exam by:: B. Speilemn, RN  No HSV lesions noted AROM clear  Extremities: no edema, erythema, or tenderness Neurologic: Grossly intact Psychiatric: mood appropriate, affect full  Labs: No results found for this or any previous visit (from the past 24 hour(s)).  Assessment: 28 y.o. [id number] [redacted]w[redacted]d by 03/24/2019, by Last Menstrual Period presenting in term labor  Plan: 1) Term labor - expectant management  2) Fetus - cat I tracing  3) PNL - Blood  type --/--/A positive (12/18 0000) / Anti-bodyscreen Negative (12/18 0000) / Rubella   / Varicella Immune / RPR Nonreactive (05/07 0000) / HBsAg Negative (12/18 0000) / HIV Non-reactive (05/07 0000) / 1-hr OGTT abnromal / GBS Negative (07/06 0930)  4) Immunization History -  There is no immunization history for the selected administration types on file for this patient.  5) Disposition - pending delivery  Malachy Mood, MD, [facility] 03/21/2019, 1:46 AM

## 2019-03-21 NOTE — OB Triage Note (Signed)
Pt reports to unit c/o of ctx every 2=5 min that started 2100 last night, Pt reports positive fetal movement, denies vaginal bleeding and LOF.

## 2019-03-21 NOTE — Progress Notes (Addendum)
Post Partum Day 0 (interview with Spanish interpretor present-Auto) Subjective: voiding and tolerating PO Having cramping (Pitocin still running). Taking analgesics for pain. Bottle feeding  Objective: Blood pressure 103/72, pulse (!) 102, temperature 98.5 F (36.9 C), temperature source Oral, resp. rate 18, height 5' (1.524 m), weight 75.8 kg, last menstrual period 06/17/2018, SpO2 100 %, unknown if currently breastfeeding.  Physical Exam:  General: alert, cooperative and no distress Lochia: appropriate Uterine Fundus: firm/ U+2 to U+3 (needs to urinate)  Fundus firm at U+1 after urinating DVT Evaluation: No evidence of DVT seen on physical exam.  Recent Labs    03/21/19 0147 03/21/19 0626  HGB 12.9 9.8*  HCT 39.3 30.0*  WBC 12.7* 17.8*  PLT 283 230    Assessment/Plan: Stable PPD 0 (6 hours PP) Mild anemia from acute blood loss  Iron and vitamins A POS/RI/VI Breast/Bottle Contraception: undecided Plan for discharge tomorrow   LOS: 0 days   Dalia Heading 03/21/2019, 9:43 AM

## 2019-03-22 ENCOUNTER — Encounter: Payer: Self-pay | Admitting: Lactation Services

## 2019-03-22 LAB — RPR: RPR Ser Ql: NONREACTIVE

## 2019-03-22 NOTE — Discharge Instructions (Signed)
Parto vaginal  Vaginal Delivery    Parto vaginal significa que usted da a luz empujando al bebé fuera del canal del parto (vagina). Un equipo de proveedores de atención médica la ayudará antes, durante y después del parto vaginal. Las experiencias de los nacimientos son únicas para todas las mujeres, y cada embarazo y las experiencias de nacimiento varían según dónde elija dar a luz.  ¿Qué ocurrirá cuando llegue al centro de parto o al hospital?  Una vez que se inicie el trabajo de parto y haya sido admitida en el hospital o centro de parto, el médico podrá hacer lo siguiente:  · Revisar sus antecedentes de embarazo y cualquier inquietud que usted pueda tener.  · Colocarle una vía intravenosa en una de las venas. Esto se podrá usar para administrarle líquidos y medicamentos.  · Verificar su presión arterial, pulso, temperatura y frecuencia cardíaca (signos vitales).  · Verificar si la bolsa de agua (saco amniótico) se ha roto (ruptura).  · Hablar con usted sobre su plan de nacimiento y analizar las opciones para controlar el dolor.  Monitoreo  Su médico puede monitorear las contracciones (monitoreo uterino) y la frecuencia cardíaca del bebé (monitoreo fetal). Es posible que el monitoreo se necesite realizar:  · Con frecuencia, pero no continuamente (intermitentemente).  · Todo el tiempo o durante largos períodos a la vez (continuamente). El monitoreo continuo puede ser necesario si:  ? Está recibiendo determinados medicamentos, tales como medicamentos para aliviar el dolor o para hacer que las contracciones sean más fuertes.  ? Tiene complicaciones durante el embarazo o el trabajo de parto.  El monitoreo se puede realizar:  · Al colocar un estetoscopio especial o un dispositivo manual de monitoreo en el abdomen o verificar los latidos cardíacos del bebé y comprobar las contracciones.  · Al colocar monitores en el abdomen (monitores externos) para registrar los latidos cardíacos del bebé y la frecuencia y duración de  las contracciones.  · Al colocar monitores dentro del útero a través de la vagina (monitores internos) para registrar los latidos cardíacos del bebé y la frecuencia, duración y fuerza de sus contracciones. Según el tipo de monitor, puede permanecer en el útero o en la cabeza del bebé hasta el nacimiento.  · Telemetría. Se trata de un tipo de monitoreo continuo que se puede realizar con monitores externos o internos. En lugar de tener que permanecer en la cama, usted puede moverse durante la telemetría.  Examen físico  Su médico puede realizar exámenes físicos frecuentes. Esto puede incluir lo siguiente:  · Verificar cómo y dónde el bebé está ubicado en el útero.  · Verificar el cuello uterino para determinar:  ? Si se está afinando o estirando (borrando).  ? Si se está abriendo (dilatando).  ¿Qué sucede durante el trabajo de parto y el parto?    El trabajo de parto y el parto normales se dividen en tres etapas:  Etapa 1  · Esta es la etapa más larga del trabajo de parto.  · Esta etapa puede durar horas o días.  · Durante esta etapa, sentirá contracciones. En general, las contracciones son leves, infrecuentes e irregulares al principio. Se hacen más fuertes, más frecuentes (aproximadamente cada 2 o 3 minutos) y más regulares a medida que avanza en esta etapa.  · Esta etapa finaliza cuando el cuello uterino está completamente dilatado hasta 4 pulgadas (10 cm) y completamente borrado.  Etapa 2  · Esta etapa comienza una vez que el cuello uterino está totalmente borrado y dilatado,   vagina.  Puede sentir un dolor urente y por estiramiento, especialmente cuando la parte ms ancha de la cabeza del beb pasa a travs de la abertura vaginal (coronacin).  Una vez que el beb nace, el cordn umbilical se pinzar y se cortar. Esto ocurre por lo general despus  de un perodo de 1 a 2 minutos despus del parto.  Colocarn al beb sobre su pecho desnudo (contacto piel con piel) en una posicin erguida y [place] con Josefine Class abrigada. Observe al beb para detectar seales de hambre, como el reflejo de bsqueda o succin, y acrquelo al pecho para su primera alimentacin. Etapa 3  Esta etapa comienza inmediatamente despus del nacimiento del beb y finaliza despus de la expulsin de la placenta.  Esta etapa puede durar de 5 a 30 minutos.  Despus del nacimiento del beb, puede sentir contracciones cuando el cuerpo expulsa la placenta y el tero se contrae para Publishing copy. Qu puedo esperar despus del [place] de parto y [place]?  Una vez que termine el trabajo de [place], se los controlar a usted y al beb atentamente para Best boy la seguridad de que ambos estn sanos y listos para ir a Holiday representative. Su equipo de atencin Building surveyor cmo cuidarse y cuidar a su beb.  Usted y el beb permanecern en la misma habitacin (cohabitacin) durante su estada en el hospital. Esto estimular una vinculacin temprana y Berdine [place].  Puede seguir recibiendo lquidos o medicamentos por va intravenosa.  Se le controlar y Community education officer el tero con regularidad (masaje fndico).  Tendr algo de inflamacin y dolor en el abdomen, la vagina y la zona de la piel entre la abertura vaginal y el ano (perineo).  Si se le realiz una incisin cerca de la vagina (episiotoma) o si ha tenido Theme park manager parto, podran indicarle que se coloque compresas fras sobre la episiotoma o Psychiatrist. Esto ayuda a Best boy y la hinchazn.  Es posible que le den una botella rociadora para que use cuando vaya al bao para higienizarse. Siga los pasos a continuacin para usar la botella rociadora: ? Antes de orinar, llene la botella rociadora con agua tibia. No use agua caliente. ? Despus de Garment/textile technologist, [place] an est sentada en el inodoro,  use la botella rociadora para enjuagar el rea alrededor de la uretra y la abertura vaginal. Con esto podr limpiar cualquier rastro de orina y [place]. ? Llene la botella rociadora con agua limpia cada vez que vaya al bao.  Es normal tener hemorragia vaginal despus del [place]. Use un apsito sanitario para el sangrado vaginal y secrecin. Resumen  Parto vaginal significa que usted dar a luz empujando al beb fuera del canal del parto (vagina).  Su mdico puede monitorear las contracciones (monitoreo uterino) y la frecuencia cardaca del beb (monitoreo fetal).  Su mdico puede realizarle un examen fsico.  El trabajo de parto y el parto normales se dividen en tres etapas.  Una vez que termina el [place] de [place], se los controlar a usted y al beb atentamente hasta que estn listos para ir a casa. Esta informacin no tiene Marine scientist el consejo del mdico. Asegrese de hacerle al mdico cualquier pregunta que tenga. Document Released: 07/27/2008 Document Revised: 10/24/2017 Document Reviewed: 10/24/2017 Elsevier Patient Education  2020 [employer].

## 2019-03-22 NOTE — Progress Notes (Signed)
Discharge instructions provided.  Pt verbalizes understanding of all instructions and follow-up care via interpreter.  Pt discharged to home with infant on 03/22/19 at 1257 via wheelchair by RN. Reed Breech, RN 03/22/2019 1:05 PM

## 2019-03-22 NOTE — Discharge Summary (Signed)
OB Discharge Summary     Patient Name: Kristine Blake DOB: 02-11-1991 MRN: [id number]  Date of admission: 03/21/2019 Delivering MD: Georgianne Fick  Date of Delivery: 03/21/2019  Date of discharge: 03/22/2019  Admitting diagnosis: 39 wks preg contractons Intrauterine pregnancy: [redacted]w[redacted]d     Secondary diagnosis: None     Discharge diagnosis: Term Pregnancy Delivered, No other diagnosis                         Hospital course:  Onset of Labor With Vaginal Delivery     28 y.o. yo [id number] at [redacted]w[redacted]d was admitted in Latent Labor on 03/21/2019. Patient had an uncomplicated labor course as follows:  Membrane Rupture Time/Date: 2:10 AM ,03/21/2019   Intrapartum Procedures: Episiotomy: None [1]                                         Lacerations:  None [1]  Patient had a delivery of a Viable infant. 03/21/2019  Information for the patient's newborn:  Kylena Mole, Girl Bhumi [[id number]]  Delivery Method: Vag-Spont     Pateint had an uncomplicated postpartum course.  She is ambulating, tolerating a regular diet, passing flatus, and urinating well. Patient is discharged home in stable condition on 03/22/19.                                                                  Post partum procedures:none  Complications: None  Physical exam on 03/22/2019: Vitals:   03/21/19 1208 03/21/19 1711 03/21/19 2300 03/22/19 0739  BP: 97/67 101/69 98/64 (!) 99/59  Pulse: 89 88 71 67  Resp: 18 20 18 18   Temp: 99.3 F (37.4 C) 98.4 F (36.9 C) 98.7 F (37.1 C) 97.7 F (36.5 C)  TempSrc: Oral Oral Oral Oral  SpO2: 100% 100% 100% 99%  Weight:      Height:       General: alert, cooperative and no distress Lochia: appropriate Uterine Fundus: firm Incision: N/A DVT Evaluation: No evidence of DVT seen on physical exam.  Labs: Lab Results  Component Value Date   WBC 17.8 (H) 03/21/2019   HGB 9.8 (L) 03/21/2019   HCT 30.0 (L) 03/21/2019   MCV 81.1 03/21/2019   PLT 230 03/21/2019   CMP Latest Ref Rng  & Units 10/20/2016  Glucose 65 - 99 mg/dL 96  BUN 6 - 20 mg/dL 9  Creatinine 0.44 - 1.00 mg/dL 0.53  Sodium 135 - 145 mmol/L 139  Potassium 3.5 - 5.1 mmol/L 3.8  Chloride 101 - 111 mmol/L 108  CO2 22 - 32 mmol/L 25  Calcium 8.9 - 10.3 mg/dL 9.0    Discharge instruction: per After Visit Summary.  Medications:  Allergies as of 03/22/2019   No Known Allergies     Medication List    STOP taking these medications   glyBURIDE 2.5 MG tablet Commonly known as: DIABETA     TAKE these medications   acyclovir 400 MG tablet Commonly known as: Zovirax Take 1 tablet (400 mg total) by mouth 2 (two) times daily for 120 doses.   acyclovir 400 MG tablet Commonly known as: Zovirax Take  1 tablet (400 mg total) by mouth 5 (five) times daily.   multivitamin-prenatal 27-0.8 MG Tabs tablet Take 1 tablet by mouth daily at 12 noon.       Diet: routine diet  Activity: Advance as tolerated. Pelvic rest for 6 weeks.   Outpatient follow up: Follow-up Information    [facility] Schedule an appointment as soon as possible for a visit in 6 week(s).   Contact information: [address] [place] [phone]            Postpartum contraception: Combination OCPs Rhogam Given postpartum: no Rubella vaccine given postpartum: no Varicella vaccine given postpartum: no TDaP given antepartum or postpartum: Yes  Newborn Data: Live born female  Birth Weight: 7 lb 10.1 oz (3460 g) APGAR: 8, 9  Newborn Delivery   Birth date/time: 03/21/2019 02:13:00 Delivery type: Vaginal, Spontaneous       Baby Feeding: Bottle  Disposition:home with mother  SIGNED: Letitia Libraobert Paul , MD 03/22/2019 9:11 AM

## 2019-05-01 ENCOUNTER — Other Ambulatory Visit: Payer: Self-pay

## 2019-05-01 ENCOUNTER — Ambulatory Visit: Payer: Self-pay | Admitting: Physician Assistant

## 2019-05-01 ENCOUNTER — Encounter: Payer: Self-pay | Admitting: Physician Assistant

## 2019-05-01 DIAGNOSIS — E663 Overweight: Secondary | ICD-10-CM

## 2019-05-01 DIAGNOSIS — O24415 Gestational diabetes mellitus in pregnancy, controlled by oral hypoglycemic drugs: Secondary | ICD-10-CM

## 2019-05-01 DIAGNOSIS — Z3009 Encounter for other general counseling and advice on contraception: Secondary | ICD-10-CM

## 2019-05-01 LAB — HEMOGLOBIN, FINGERSTICK: Hemoglobin: 11.3 g/dL (ref 11.1–15.9)

## 2019-05-01 MED ORDER — THERA VITAL M PO TABS: 1.0000 | ORAL_TABLET | Freq: Every day | ORAL | 0 refills | Status: DC

## 2019-05-01 NOTE — Progress Notes (Signed)
Patient Hgb reviewed, no treatment indicated. Patient given MVI, and scheduled for 2 hr. Gtt.Jenetta Downer, RN

## 2019-05-01 NOTE — Addendum Note (Signed)
Addended by: Jenetta Downer on: 05/01/2019 05:27 PM   Modules accepted: Orders

## 2019-05-01 NOTE — Progress Notes (Signed)
[place] Partum Exam  Kristine Blake is a 28 y.o. [id number] female who presents for a postpartum visit. She is 6 weeks postpartum following a spontaneous vaginal delivery. I have fully reviewed the prenatal and intrapartum course. The delivery was at 21 5/7 gestational weeks.  Anesthesia: MAC and not reported. Postpartum course has been uneventful. Baby's course has been uneventful. Infant girl named "Luna", 7lb at birth per mom. Baby is feeding by bottle - unknown formula type. Bleeding no bleeding. Bowel function is normal. Bladder function is normal. Patient is not sexually active. Contraception method is coitus interruptus. Stopped home BS monitoring and oral hypoglycemic agents at delivery.  Postpartum depression screening: EPDS = 3.    The following portions of the patient's history were reviewed and updated as appropriate: allergies, current medications, past family history, past medical history, past social history, past surgical history and problem list. See routine health survey form. Last pap smear done 05/2016 and was Normal  Review of Systems Pertinent items are noted in HPI.    Objective:  BP 105/71   Wt 151 lb (68.5 kg)   Breastfeeding No   BMI 29.49 kg/m   General:  alert   Breasts:  inspection negative, no nipple discharge or bleeding, no masses or nodularity palpable  Lungs: clear to auscultation bilaterally  Heart:  regular rate and rhythm, S1, S2 normal, no murmur, click, rub or gallop  Abdomen: soft, non-tender; bowel sounds normal; no masses,  no organomegaly   Vulva:  normal  Vagina: normal vagina, no discharge, exudate, lesion, or erythema  Cervix:  multiparous appearance  Corpus: enlarged, 6 weeks size  Adnexa:  no mass, fullness, tenderness  Rectal Exam: Not performed. and no external hemorrhoids        Assessment:    Normal postpartum exam. Pap smear done at today's visit. Hgb 11.3 g/dL. Sched 2h OGTT due to hx GDM this pregnancy  Plan:   1.  Contraception: coitus interruptus. Pt declines more statistically reliable BCM, including LARCs. Counseled to delay any future preg for at least 18 mo. 2. Infant feeding:  patient is currently feeding with bottle.  If breastmilk feeding patient was given letter for employer to provide appropriate pumping time to express breastmilk.  3. Mood: EPDS is low risk. Reviewed resources and that mood sx in first year after pregnancy are considered related to pregnancy and to reach out for help at [facility] if needed. Discussed [facility] as link to care and availability of LCSW for counseling  4. Chronic Medical Conditions:  Overweight - enc diet/exercise.   GDM - oral meds this pregnacy - rec intermittent screening for DM if 2h OGTT normal.  Patient given handout about PCP care in the community Given MVI per family planning program  Follow up in: 2 weeks for OGTT or as needed.

## 2019-05-01 NOTE — Progress Notes (Signed)
Patient here for PP exam. Declines BCM. Needs Hgb check today and needs 2 hr. Gtt scheduled.Jenetta Downer, RN

## 2019-05-06 LAB — IGP, RFX APTIMA HPV ASCU: PAP Smear Comment: 0

## 2019-05-12 ENCOUNTER — Other Ambulatory Visit (LOCAL_COMMUNITY_HEALTH_CENTER): Payer: Self-pay

## 2019-05-12 ENCOUNTER — Other Ambulatory Visit: Payer: Self-pay

## 2019-05-12 DIAGNOSIS — Z8632 Personal history of gestational diabetes: Secondary | ICD-10-CM

## 2019-05-12 NOTE — Progress Notes (Signed)
Presents to [facility] for 2 hour GTT (had post-partum appt at [facility] 05/01/2019). Per client, has been NPO since 8 pm last night. Client aware to remain at [facility] during testing and notify Nurse Clinic for problems / questions. Client aware when to return to lab for blood draw. Rich Number, RN

## 2019-05-13 LAB — GLUCOSE TOLERANCE, 2 HOURS
Glucose, 2 hour: 89 mg/dL (ref 65–139)
Glucose, GTT - Fasting: 98 mg/dL (ref 65–99)

## 2019-05-15 NOTE — Addendum Note (Signed)
Addended by: Cletis Media on: 05/15/2019 10:29 AM   Modules accepted: Orders

## 2020-08-22 IMAGING — US US OB < 14 WEEKS - US OB TV
1 series · 14 of 28 positions shown · non-contrast
Comparison: None.

CLINICAL DATA: First-trimester care, viability

EXAM:
OBSTETRIC <14 WK US AND TRANSVAGINAL OB US
TECHNIQUE: Both transabdominal and transvaginal ultrasound examinations were
performed for complete evaluation of the gestation as well as the
maternal uterus, adnexal regions, and pelvic cul-de-sac.
Transvaginal technique was performed to assess early pregnancy.

[Series 1: us ob < 14 weeks - us ob tv · 0.10mm/px · 14 of 104 slices shown]
[im 4/104]
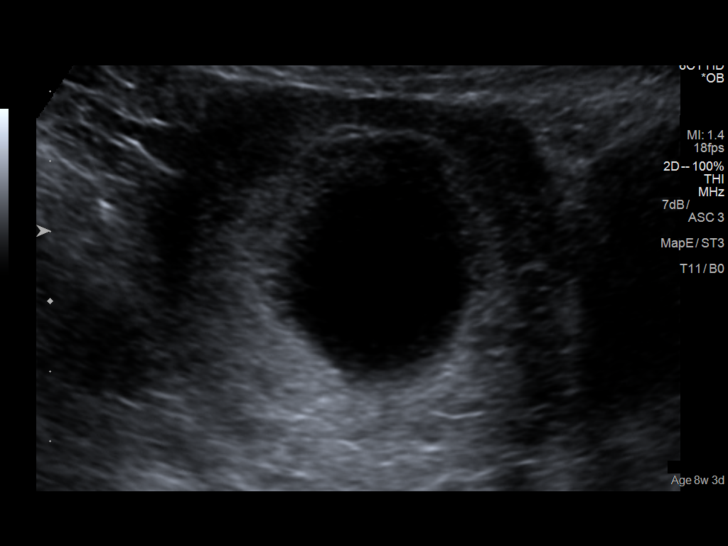
[im 12/104]
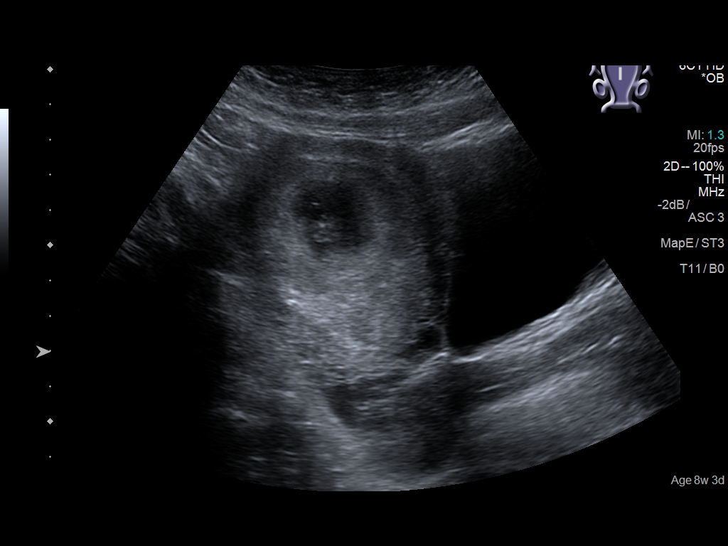
[im 20/104]
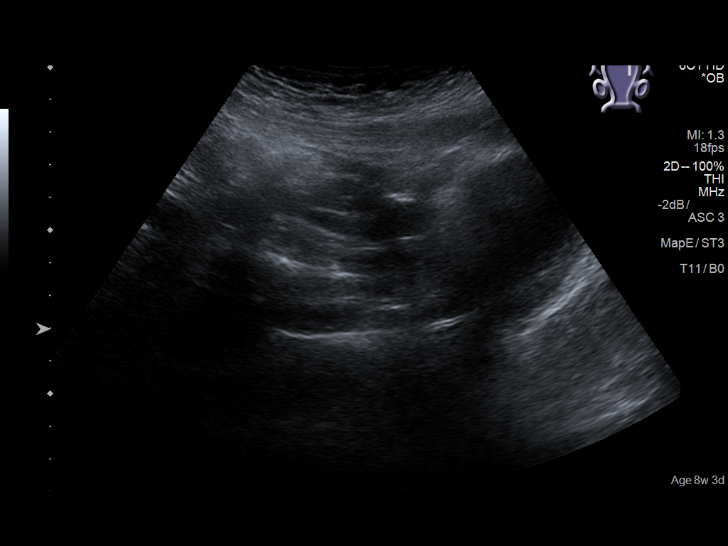
[im 27/104]
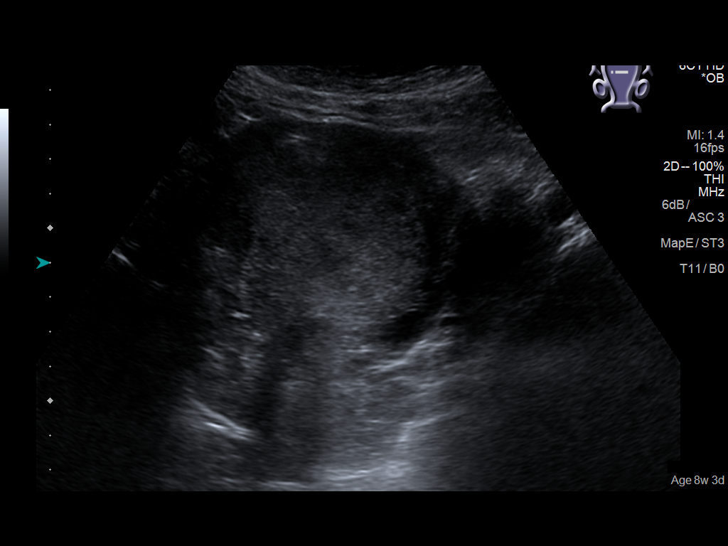
[im 35/104]
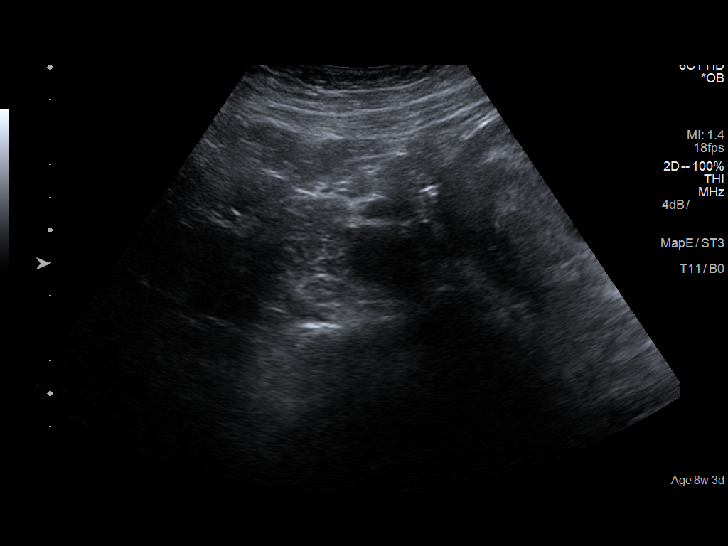
[im 42/104]
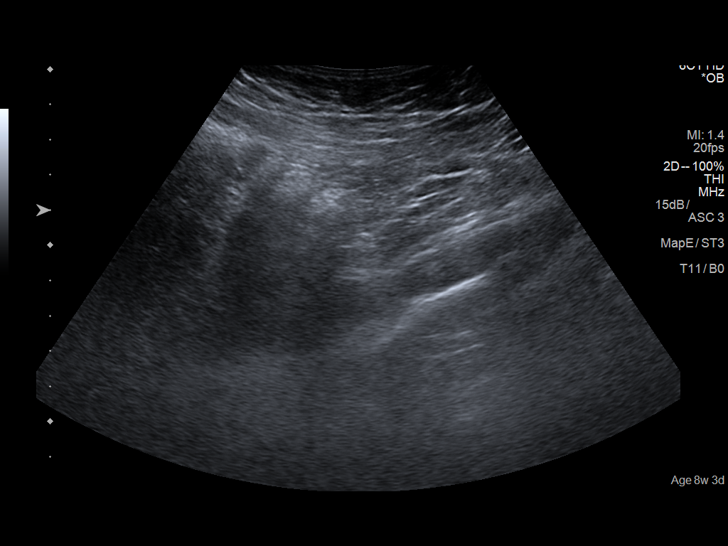
[im 50/104]
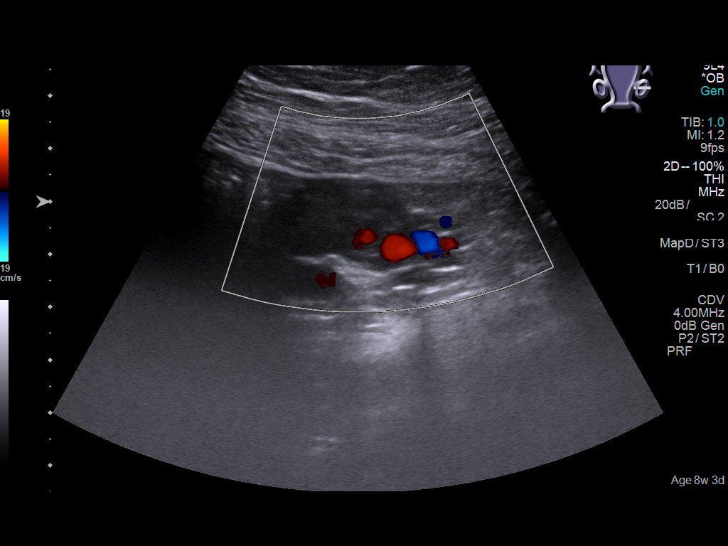
[im 58/104]
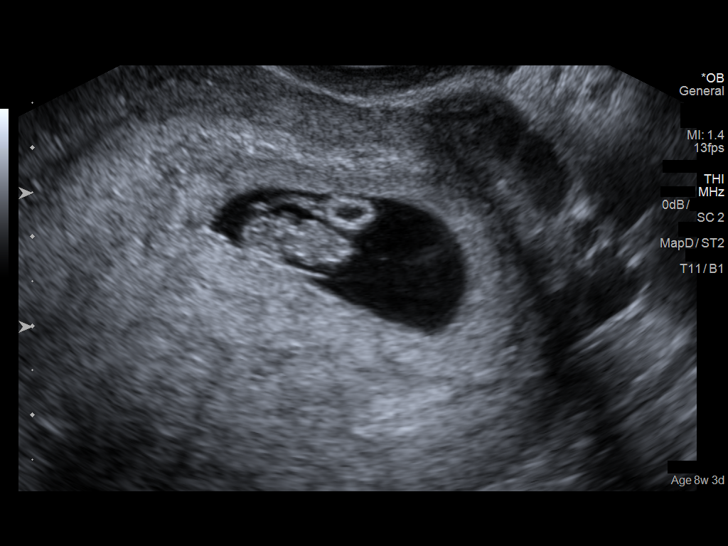
[im 65/104]
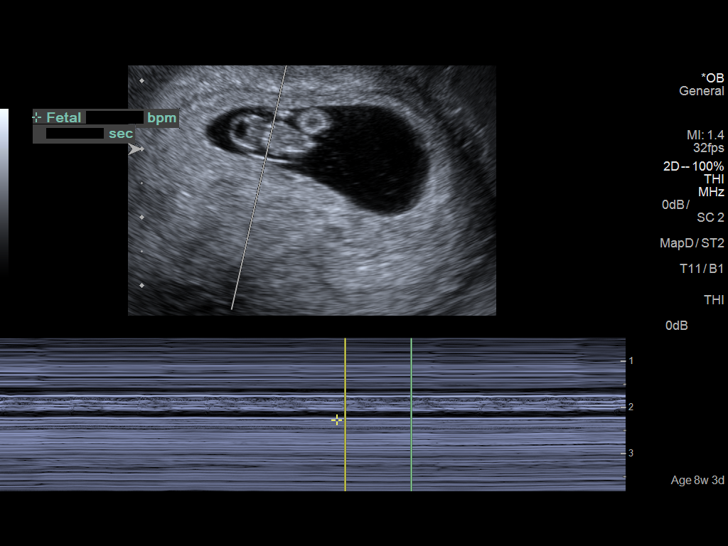
[im 73/104]
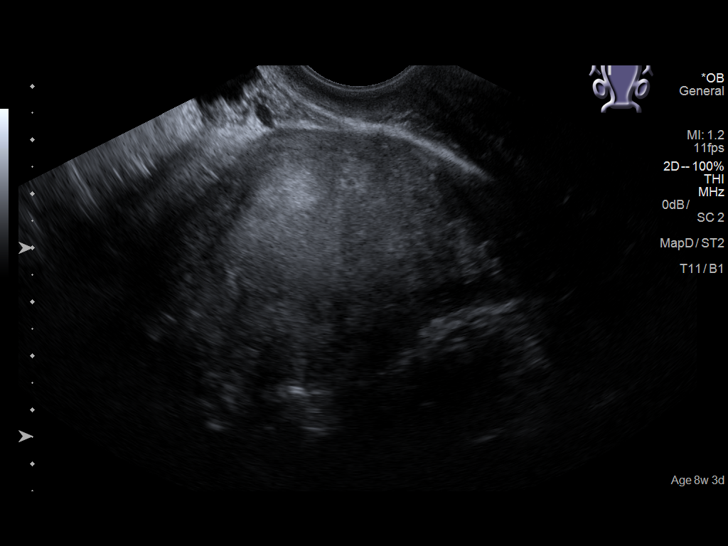
[im 81/104]
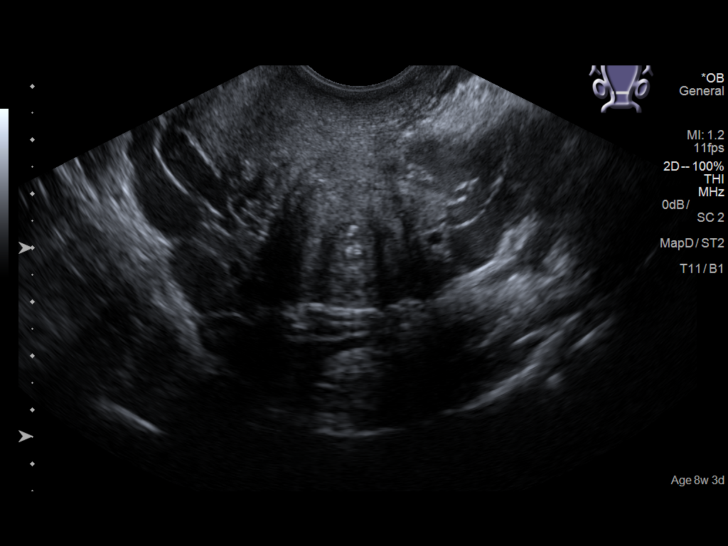
[im 88/104]
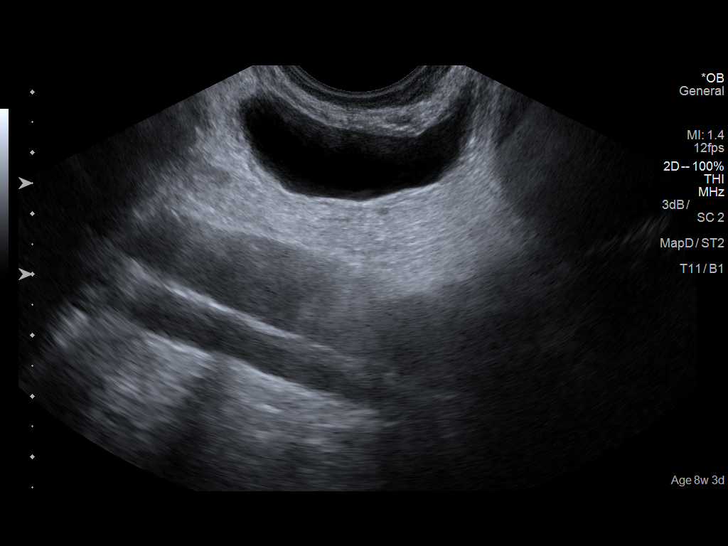
[im 96/104]
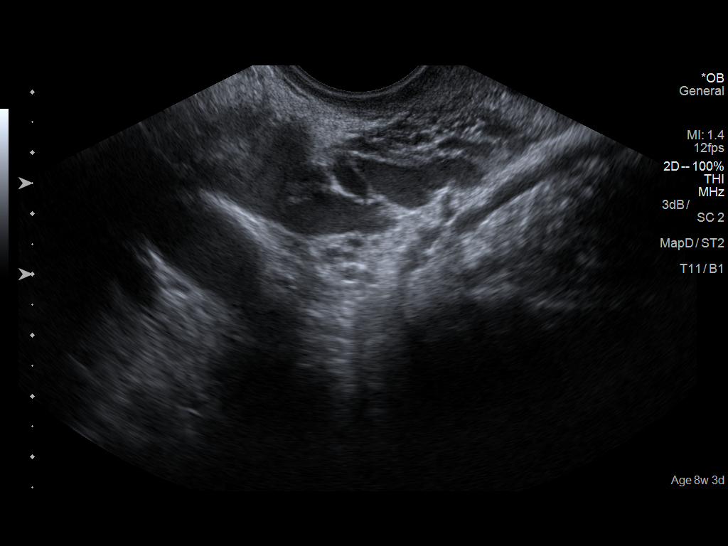
[im 104/104]
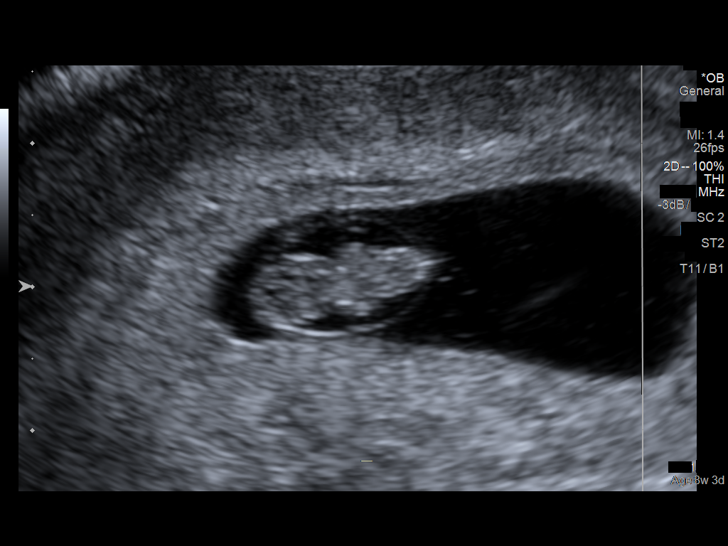

[14 of 28 positions shown; findings below may reference images not displayed]

FINDINGS: Intrauterine gestational sac: Single intrauterine gestational sac

Yolk sac:  Visible

Embryo:  Visible

Cardiac Activity: Visible

Heart Rate: 155 bpm

CRL: 13.6 mm   7 w   4 d                  US EDC: 03/30/2019

Subchorionic hemorrhage:  None visualized.

Maternal uterus/adnexae: Right ovary is within normal limits and
measures 4.9 x 1.2 x 2.3 cm. The left ovary is nonvisualized. No
significant free fluid
IMPRESSION: 1. Single viable intrauterine pregnancy as above.
2. Nonvisualized left ovary

## 2021-08-28 NOTE — L&D Delivery Note (Signed)
Delivery Note  Kristine Blake is a [id number] at [redacted]w[redacted]d, Patient's last menstrual period was 10/01/2021 (exact date)., consistent with [place] at [redacted]w[redacted]d.   First Stage: Labor onset: 0300 Augmentation:  None Analgesia /Anesthesia intrapartum: None SROM at 0500 GBS: positive IP Antibiotics: ampicillin x 1 dose   Second Stage: Complete dilation at 0750 Onset of pushing at 0750 FHR second stage 150 bpm with moderate, occasional variable decels with pushing   Kristine Blake presented to L&D in active labor. She was expectantly managed progressed quickly to C/C/+2 with an urge to push.  She pushed effectively over approximately 20 minutes for a quick spontaneous vaginal birth.  Delivery of a viable baby boy on 06/26/2022 at 0811 by CNM Delivery of fetal head in OA position with restitution to ROT. No nuchal cord;  Anterior then posterior shoulders delivered easily with gentle downward traction. Baby placed on mom's chest, and attended to by baby RN Cord double clamped after cessation of pulsation, cut by Kristine Banning, RN per patient and partner's preference.  Cord blood sample collection: Not Indicated A POS  Third Stage: Oxytocin bolus started after delivery of infant for hemorrhage prophylaxis  Placenta delivered intact with 3 VC @ 0820 Placenta disposition: discarded  Uterine tone firm with massage ->firm / bleeding initially brisk -> small  Uterus initially firm with massage with brisk bleeding noted.  Bleeding decreased with fundal massage.  Methergine 0.2mg  IM given x 1 dose.  Uterus firm with small bleeding after oxytocin and methergine.  No laceration identified  Anesthesia for repair: N/A Repair - Not indicated  Est. Blood Loss (mL): 710 ml  Complications: None  Medications given in 4th stage: -Oxytocin IV bolus -Methergine 0.2 mg IM   Mom to postpartum.  Baby to Couplet care / Skin to Skin.  Newborn: Information for the patient's newborn:  Kristine, Blake  [[id number]]  Live born female "Kristine Blake" Birth Weight:  pending  APGAR: 8, 9  Newborn Delivery   Birth date/time: 06/26/2022 08:11:00 Delivery type: Vaginal, Spontaneous       Feeding planned: breast and formula feeding  ---------- Kristine Blake, CNM Certified Nurse Midwife [facility]

## 2021-11-28 ENCOUNTER — Ambulatory Visit (LOCAL_COMMUNITY_HEALTH_CENTER): Payer: Self-pay

## 2021-11-28 VITALS — BP 130/85 | Wt 152.5 lb

## 2021-11-28 DIAGNOSIS — Z3201 Encounter for pregnancy test, result positive: Secondary | ICD-10-CM

## 2021-11-28 LAB — PREGNANCY, URINE: Preg Test, Ur: POSITIVE — AB

## 2021-11-28 MED ORDER — PRENATAL VITAMINS 28-0.8 MG PO TABS
28.0000 mg | ORAL_TABLET | Freq: Every day | ORAL | 0 refills | Status: AC
Start: 2021-11-28 — End: 2022-03-08

## 2021-11-28 MED ORDER — PRENATAL VITAMINS 28-0.8 MG PO TABS: 28.0000 mg | ORAL_TABLET | Freq: Every day | ORAL | 0 refills | Status: DC

## 2021-11-28 NOTE — Progress Notes (Signed)
UPT positive. Plans prenatal care here at [facility];referred to clerk to schedule appt.   ?Counseled re:  PNV daily, drink plenty of fluids, when to seek medical attention for any problems, etc. Pregnancy information packet given.  Cherlynn Polo, RN  ?

## 2021-12-26 ENCOUNTER — Encounter: Payer: Self-pay | Admitting: Nurse Practitioner

## 2021-12-26 ENCOUNTER — Ambulatory Visit: Payer: Medicaid Other | Admitting: Nurse Practitioner

## 2021-12-26 VITALS — BP 112/77 | HR 84 | Temp 98.2°F | Wt 155.0 lb

## 2021-12-26 DIAGNOSIS — Z8632 Personal history of gestational diabetes: Secondary | ICD-10-CM | POA: Insufficient documentation

## 2021-12-26 DIAGNOSIS — O0991 Supervision of high risk pregnancy, unspecified, first trimester: Secondary | ICD-10-CM

## 2021-12-26 DIAGNOSIS — O0990 Supervision of high risk pregnancy, unspecified, unspecified trimester: Secondary | ICD-10-CM | POA: Insufficient documentation

## 2021-12-26 DIAGNOSIS — B009 Herpesviral infection, unspecified: Secondary | ICD-10-CM

## 2021-12-26 HISTORY — DX: Personal history of gestational diabetes: Z86.32

## 2021-12-26 LAB — URINALYSIS
Bilirubin, UA: NEGATIVE
Glucose, UA: NEGATIVE
Ketones, UA: NEGATIVE
Leukocytes,UA: NEGATIVE
Nitrite, UA: NEGATIVE
Protein,UA: NEGATIVE
RBC, UA: NEGATIVE
Specific Gravity, UA: 1.015 (ref 1.005–1.030)
Urobilinogen, Ur: 0.2 mg/dL (ref 0.2–1.0)
pH, UA: 6.5 (ref 5.0–7.5)

## 2021-12-26 LAB — WET PREP FOR TRICH, YEAST, CLUE
Trichomonas Exam: NEGATIVE
Yeast Exam: NEGATIVE

## 2021-12-26 LAB — HEMOGLOBIN, FINGERSTICK: Hemoglobin: 12.9 g/dL (ref 11.1–15.9)

## 2021-12-26 NOTE — Progress Notes (Signed)
Patient here for new OB at 12 2/7. Lives with her husband and their 4 kids. Neg PPDR in 12/17/2011, denies international travel since that time. Normal Hgb 09/11/2012.  History of GDM, 1 hour gtt today, needs to be to lab at 3:10 for 3:20 blood draw. Declines flu vaccine. Burt Knack, RN  ?

## 2021-12-26 NOTE — Progress Notes (Signed)
In house labs reviewed with provider, no treatment indicated. Patient declines dental list, states she has a dentist..Albie Arizpe Film/video editor, RN  ?

## 2021-12-26 NOTE — Progress Notes (Signed)
[facility] Department  ?Maternal Health Clinic ? ? ?INITIAL PRENATAL VISIT NOTE ? ?Subjective:  ?Kristine Blake is a 31 y.o. [id number] at [redacted]w[redacted]d being seen today to start prenatal care at the [facility] Department.  She is currently monitored for the following issues for this high-risk pregnancy and has Herpes; History of postpartum hemorrhage  (2019); Overweight (BMI 25.0-29.9); Supervision of high risk pregnancy, antepartum; and History of gestational diabetes 12/2018 on their problem list. ? ?Patient reports headache and nausea.  Contractions: Not present. Vag. Bleeding: None.  Movement: Absent. Denies leaking of fluid.  ? ?Indications for ASA therapy (per uptodate) ?One of the following: ?Previous pregnancy with preeclampsia, especially early onset and with an adverse outcome No ?Multifetal gestation No ?Chronic hypertension No ?Type 1 or 2 diabetes mellitus No ?Chronic kidney disease No ?Autoimmune disease (antiphospholipid syndrome, systemic lupus erythematosus) No ? ?Two or more of the following: ?Nulliparity No ?Obesity (body mass index >30 kg/m2) No ?Family history of preeclampsia in mother or sister No ?Age ?35 years No ?Sociodemographic characteristics (African American race, low socioeconomic level) Yes ?Personal risk factors (eg, previous pregnancy with low birth weight or small for gestational age infant, previous adverse pregnancy outcome [eg, stillbirth], interval >10 years between pregnancies) No ? ? ?The following portions of the patient's history were reviewed and updated as appropriate: allergies, current medications, past family history, past medical history, past social history, past surgical history and problem list. Problem list updated. ? ?Objective:  ? ?Vitals:  ? 12/26/21 1349  ?BP: 112/77  ?Pulse: 84  ?Temp: 98.2 ?F (36.8 ?C)  ?Weight: 155 lb (70.3 kg)  ? ? ?Fetal Status: Fetal Heart Rate (bpm): 160 Fundal Height: 12 cm Movement: Absent  Presentation:  Undeterminable ? ? ?Physical Exam ?Vitals and nursing note reviewed.  ?Constitutional:   ?   General: She is not in acute distress. ?   Appearance: Normal appearance. She is well-developed.  ?HENT:  ?   Head: Normocephalic and atraumatic.  ?   Right Ear: External ear normal.  ?   Left Ear: External ear normal.  ?   Nose: Nose normal. No congestion or rhinorrhea.  ?   Mouth/Throat:  ?   Lips: Pink.  ?   Mouth: Mucous membranes are moist.  ?   Dentition: Normal dentition. No dental caries.  ?   Pharynx: Oropharynx is clear. Uvula midline.  ?   Comments: Dentition: No visible signs of dental caries  ?Eyes:  ?   General: No scleral icterus. ?   Conjunctiva/sclera: Conjunctivae normal.  ?   Pupils: Pupils are equal, round, and reactive to light.  ?Neck:  ?   Thyroid: No thyroid mass or thyromegaly.  ?Cardiovascular:  ?   Rate and Rhythm: Normal rate and regular rhythm.  ?   Pulses: Normal pulses.  ?   Comments: Extremities are warm and well perfused ?Pulmonary:  ?   Effort: Pulmonary effort is normal.  ?   Breath sounds: Normal breath sounds.  ?Chest:  ?   Chest wall: No mass.  ?Breasts: ?   Tanner Score is 5.  ?   Breasts are symmetrical.  ?   Right: Normal. No mass, nipple discharge or skin change.  ?   Left: Normal. No mass, nipple discharge or skin change.  ?   Comments: Breasts:  ?      Right: Normal. No swelling, mass, nipple discharge, skin change or tenderness.  ?      Left:  Normal. No swelling, mass, nipple discharge, skin change or tenderness.   ?Abdominal:  ?   General: Abdomen is flat. Bowel sounds are normal.  ?   Palpations: Abdomen is soft.  ?   Tenderness: There is no abdominal tenderness.  ?   Comments: Gravid   ?Genitourinary: ?   General: Normal vulva.  ?   Exam position: Lithotomy position.  ?   Pubic Area: No rash.   ?   Labia:     ?   Right: No rash.     ?   Left: No rash.   ?   Vagina: Normal. No vaginal discharge.  ?   Cervix: No cervical motion tenderness or friability.  ?   Uterus: Normal.  Enlarged (Gravid 12 size). Not tender.   ?   Adnexa: Right adnexa normal and left adnexa normal.  ?   Rectum: Normal. No external hemorrhoid.  ?   Comments: External genitalia/pubic area without nits, lice, edema, erythema, lesions and inguinal adenopathy. ?Vagina with normal mucosa and discharge. ?Cervix visualized friable, ectropion cervix  ?Uterus firm, mobile, nt, no masses, no CMT, no adnexal tenderness or fullness.  ?Musculoskeletal:  ?   Cervical back: Full passive range of motion without pain, normal range of motion and neck supple.  ?   Right lower leg: No edema.  ?   Left lower leg: No edema.  ?Lymphadenopathy:  ?   Cervical: No cervical adenopathy.  ?   Upper Body:  ?   Right upper body: No axillary adenopathy.  ?   Left upper body: No axillary adenopathy.  ?Skin: ?   General: Skin is warm and dry.  ?   Capillary Refill: Capillary refill takes less than 2 seconds.  ?Neurological:  ?   Mental Status: She is alert and oriented to person, place, and time.  ?Psychiatric:     ?   Attention and Perception: Attention normal.     ?   Mood and Affect: Mood normal.     ?   Speech: Speech normal.     ?   Behavior: Behavior normal. Behavior is cooperative.  ? ? ?Assessment and Plan:  ?Pregnancy: [id number] at [redacted]w[redacted]d ? ?1. Supervision of high risk pregnancy, antepartum ?-31 year old female in clinic today for prenatal care. ?-Patient reports a history headaches and nausea.  Encouraged patient to drink at least 6-8 glasses of 8oz of water to managed headaches.  Patient advised to eat meals that are high in protein every 2 hours. Patient also given resource sheet on nausea.  ?-Patient states she is happy about the pregnancy. ?-Patient currently lives with her husband and children.  She currently not working.   ?-Father of the baby is present. ?-Patient reports LMP 10/01/21.   Will send patient for initial/dating U/S. ?-No recent ED visit.  ?-PAP performed today.  No history of abnormal PAPs.  Cervix friable and possible  cervical ectropion. Will await PAP results.   ?-UDS today.  ?-Last dental visit 6 months ago.  Advised patient that dental care is safe during pregnancy.  ?-Patient declines genetic counseling and NIPs today.   ?-PHQ-9= 4 ?-Patient denies receiving COVID vaccines and declines Flu today.  ?-Hgb= 12.9 ? ?- Prenatal profile without Varicella or Rubella ?- Lead, blood (adult age 11 yrs or greater) ?- HIV-1/HIV-2 Qualitative RNA ?- Glucose tolerance, 1 hour ?- Chlamydia/GC NAA, Confirmation ?- HCV Ab w Reflex to Quant PCR ?- Urine Culture ?- IGP, Aptima HPV ?- WET PREP FOR  TRICH, YEAST, CLUE ?- Hemoglobin, venipuncture ?- Urinalysis (Urine Dip) ?- [id number] Drug Screen ? ?2. Herpes ?-Patient with a history of HSV.  Will continue to monitor and place on prophylactic treatment at 36 weeks. ? ?3. History of gestational diabetes ?-History of gestational diabetes.  Glucose tolerance test today.  ? ? ? ?Discussed overview of care and coordination with inpatient delivery practices including [facility], [facility].  ? ? ? ?Term labor symptoms and general obstetric precautions including but not limited to vaginal bleeding, contractions, leaking of fluid and fetal movement were reviewed in detail with the patient. ? ?Please refer to After Visit Summary for other counseling recommendations.  ? ?Return in about 4 weeks (around 01/23/2022) for Routine prenatal care visit. ? ?Future Appointments  ?Date Time Provider Department Center  ?01/25/2022  8:20 AM [facility] PROVIDER [facility] None  ? ? ?Glenna FellowsAyo Zaahir Pickney, FNP ? ?

## 2021-12-27 LAB — CBC/D/PLT+RPR+RH+ABO+AB SCR
Antibody Screen: NEGATIVE
Basophils Absolute: 0 10*3/uL (ref 0.0–0.2)
Basos: 0 %
EOS (ABSOLUTE): 0.1 10*3/uL (ref 0.0–0.4)
Eos: 1 %
Hematocrit: 38.2 % (ref 34.0–46.6)
Hemoglobin: 13.1 g/dL (ref 11.1–15.9)
Hepatitis B Surface Ag: NEGATIVE
Immature Grans (Abs): 0.1 10*3/uL (ref 0.0–0.1)
Immature Granulocytes: 1 %
Lymphocytes Absolute: 2.6 10*3/uL (ref 0.7–3.1)
Lymphs: 22 %
MCH: 28.3 pg (ref 26.6–33.0)
MCHC: 34.3 g/dL (ref 31.5–35.7)
MCV: 83 fL (ref 79–97)
Monocytes Absolute: 0.9 10*3/uL (ref 0.1–0.9)
Monocytes: 7 %
Neutrophils Absolute: 8.1 10*3/uL — ABNORMAL HIGH (ref 1.4–7.0)
Neutrophils: 69 %
Platelets: 289 10*3/uL (ref 150–450)
RBC: 4.63 x10E6/uL (ref 3.77–5.28)
RDW: 13 % (ref 11.7–15.4)
RPR Ser Ql: NONREACTIVE
Rh Factor: POSITIVE
WBC: 11.8 10*3/uL — ABNORMAL HIGH (ref 3.4–10.8)

## 2021-12-27 LAB — TSH+FREE T4
Free T4: 1.05 ng/dL (ref 0.82–1.77)
TSH: 1.62 u[IU]/mL (ref 0.450–4.500)

## 2021-12-27 LAB — HCV AB W REFLEX TO QUANT PCR: HCV Ab: NONREACTIVE

## 2021-12-27 LAB — T3, FREE: T3, Free: 2.9 pg/mL (ref 2.0–4.4)

## 2021-12-27 LAB — HCV INTERPRETATION

## 2021-12-28 ENCOUNTER — Telehealth: Payer: Self-pay

## 2021-12-28 LAB — HIV-1/HIV-2 QUALITATIVE RNA
HIV-1 RNA, Qualitative: NONREACTIVE
HIV-2 RNA, Qualitative: NONREACTIVE

## 2021-12-28 LAB — 789231 7+OXYCODONE-BUND
Amphetamines, Urine: NEGATIVE ng/mL
BENZODIAZ UR QL: NEGATIVE ng/mL
Barbiturate screen, urine: NEGATIVE ng/mL
Cannabinoid Quant, Ur: NEGATIVE ng/mL
Cocaine (Metab.): NEGATIVE ng/mL
OPIATE SCREEN URINE: NEGATIVE ng/mL
Oxycodone/Oxymorphone, Urine: NEGATIVE ng/mL
PCP Quant, Ur: NEGATIVE ng/mL

## 2021-12-28 LAB — URINE CULTURE

## 2021-12-28 LAB — LEAD, BLOOD (ADULT >= 16 YRS): Lead-Whole Blood: <1 ug/dL (ref 0.0–3.4)

## 2021-12-28 LAB — CHLAMYDIA/GC NAA, CONFIRMATION
Chlamydia trachomatis, NAA: NEGATIVE
Neisseria gonorrhoeae, NAA: NEGATIVE

## 2021-12-28 LAB — GLUCOSE, 1 HOUR GESTATIONAL: Gestational Diabetes Screen: 107 mg/dL (ref 70–139)

## 2021-12-28 NOTE — Telephone Encounter (Signed)
TC to patient to inform of [place] U/S on 01/04/2022 at 2:30pm. Patient will need to arrive at 2:15 with full bladder. May bring 1 adult with her. Unable to LM as no VM set up. TC to patient partner, Wynnette Delucia at [phone]. No VM set up. Jenetta Downer, RN  ?

## 2021-12-29 ENCOUNTER — Telehealth: Payer: Self-pay

## 2021-12-29 NOTE — Telephone Encounter (Signed)
TC to patient to inform of UTI and need for treatment. LM with number to call and requested she call early tomorrow (12/30/21). [address] [place]uel Boom #[id number].Marland KitchenBurt Knack, RN  ?

## 2021-12-29 NOTE — Telephone Encounter (Signed)
Telephone call to patient this morning to discuss her [facility] [place] on 01-04-2022 at 2:30 pm (arrival time 2:15) with a full bladder.  Patient aware she can only have 1 Adult with her.  Address of [facility] given to patient.  She has also been asked to set up her voicemail for future calls and line of communication for care.  Encouraged her to seek out help from her phone carrier if she does not know how to set up her voicemail.  She verbalizes understanding.  Language Line used today.  Rod / [id number].  Hart Carwin, RN ? ?

## 2021-12-30 ENCOUNTER — Telehealth: Payer: Self-pay | Admitting: Family Medicine

## 2021-12-30 NOTE — Telephone Encounter (Signed)
Pt returned a miss call.

## 2021-12-30 NOTE — Telephone Encounter (Signed)
Pt received a call about a 12-week ultrasound on 5/10 but she would like to cancel it, she said she didn't want to have a 12-week ultrasound.  She tried to call herself to cancel the appt but the call wouldn't go through.  She is wondering if one of the nurses can call them and cancel it for her. ?

## 2021-12-31 LAB — IGP, APTIMA HPV
HPV Aptima: NEGATIVE
PAP Smear Comment: 0

## 2022-01-02 ENCOUNTER — Telehealth: Payer: Self-pay

## 2022-01-02 NOTE — Telephone Encounter (Signed)
Appt for UTI treatment scheduled for 01/03/2022. Jossie Ng, RN ? ?

## 2022-01-02 NOTE — Telephone Encounter (Signed)
Per client, her Covid test today was negative. Jossie Ng, RN ? ?

## 2022-01-02 NOTE — Telephone Encounter (Signed)
Call to client with [facility] [employer] ID # [id number] to verify she does want [place] scheduled for 01/04/2022 cancelled. Per client, cancel [place] and this was done with call to Elberta Fortis, [facility] [place] scheduler. Rich Number, RN ? ?

## 2022-01-02 NOTE — Telephone Encounter (Signed)
Call to client to ascertain if able to come for UTI treatment prior to 01/09/2022 scheduled appt. Appt for UTI treatment scheduled for 01/03/2022. Client states has runny nose and sore throat and has taken Claritin and Benadryl without relief. Counseled to do Covid test today and aware someone can pick her up a free test kit today at the [facility]. Client aware to call [facility] if Covid test is positive. Ola Spurr CNM notified of above. Rich Number, RN ? ?

## 2022-01-02 NOTE — Telephone Encounter (Signed)
Call to [facility] [place] scheduler to cancel 01/04/2022 [place] appt per client request. Jossie Ng, RN ? ?

## 2022-01-02 NOTE — Telephone Encounter (Signed)
[place] cancelled as per client request and Glenna Fellows FNP aware. Jossie Ng, RN ? ?

## 2022-01-03 ENCOUNTER — Ambulatory Visit: Payer: Medicaid Other | Admitting: Family Medicine

## 2022-01-03 VITALS — BP 119/82 | HR 85 | Temp 98.0°F | Wt 155.8 lb

## 2022-01-03 DIAGNOSIS — O0992 Supervision of high risk pregnancy, unspecified, second trimester: Secondary | ICD-10-CM

## 2022-01-03 DIAGNOSIS — B951 Streptococcus, group B, as the cause of diseases classified elsewhere: Secondary | ICD-10-CM

## 2022-01-03 DIAGNOSIS — O0990 Supervision of high risk pregnancy, unspecified, unspecified trimester: Secondary | ICD-10-CM

## 2022-01-03 MED ORDER — AMOXICILLIN 250 MG PO CAPS: 250.0000 mg | ORAL_CAPSULE | Freq: Three times a day (TID) | ORAL | 0 refills | Status: AC

## 2022-01-03 NOTE — Progress Notes (Signed)
Patient here for UTI treatment at 13 3/7.Marland KitchenBurt Knack, RN  ?

## 2022-01-04 ENCOUNTER — Ambulatory Visit: Payer: Self-pay

## 2022-01-04 NOTE — Progress Notes (Signed)
S: pt in clinic for treatment for UTI  ? ?O: 12/26/21 + group beta strep in urine  ? ?A: Group beta Strep positive ?- amoxicillin (AMOXIL) 250 MG capsule; Take 1 capsule (250 mg total) by mouth 3 (three) times daily for 10 days.  Dispense: 30 capsule; Refill: 0 ? ?P: Pt reports doing well, denies s/sx of UTI.  ?Discussed proper way to take medications, TID with food.  ?TOC at next appointment  ?Denies Edema, cramping , pain, pressure, fluid leaking or vaginal bleeding.   ?FHT assessed- 150 ? ?Language line [phone]) used for Spanish interpretation.    ? ? ?Wendi Snipes, FNP ? ? ?

## 2022-01-07 NOTE — Progress Notes (Signed)
Chart reviewed by Pharmacist  Suzanne Walker PharmD, Contract Pharmacist at [facility]  

## 2022-01-20 NOTE — Addendum Note (Signed)
Addended by: Heywood Bene on: 01/20/2022 02:20 PM   Modules accepted: Orders

## 2022-01-25 ENCOUNTER — Ambulatory Visit: Payer: Medicaid Other

## 2022-01-30 ENCOUNTER — Encounter: Payer: Self-pay | Admitting: Nurse Practitioner

## 2022-01-30 ENCOUNTER — Ambulatory Visit: Payer: Medicaid Other | Admitting: Nurse Practitioner

## 2022-01-30 VITALS — BP 96/64 | HR 79 | Temp 96.9°F | Wt 161.2 lb

## 2022-01-30 DIAGNOSIS — O2340 Unspecified infection of urinary tract in pregnancy, unspecified trimester: Secondary | ICD-10-CM | POA: Insufficient documentation

## 2022-01-30 DIAGNOSIS — E663 Overweight: Secondary | ICD-10-CM

## 2022-01-30 DIAGNOSIS — O0990 Supervision of high risk pregnancy, unspecified, unspecified trimester: Secondary | ICD-10-CM

## 2022-01-30 NOTE — Progress Notes (Signed)
[facility] Maternal Health Clinic  PRENATAL VISIT NOTE  Subjective:  Kristine Blake is a 31 y.o. [id number] at [redacted]w[redacted]d being seen today for ongoing prenatal care.  She is currently monitored for the following issues for this high-risk pregnancy and has Herpes; History of postpartum hemorrhage  (2019); Overweight (BMI 25.0-29.9); Supervision of high risk pregnancy, antepartum; History of gestational diabetes 12/2018; and UTI in pregnancy, antepartum 12/26/21 on their problem list.  Patient reports headache.  Contractions: Not present. Vag. Bleeding: None.  Movement: Absent. Denies leaking of fluid/ROM.   The following portions of the patient's history were reviewed and updated as appropriate: allergies, current medications, past family history, past medical history, past social history, past surgical history and problem list. Problem list updated.  Objective:   Vitals:   01/30/22 0813  BP: 96/64  Pulse: 79  Temp: (!) 96.9 F (36.1 C)  Weight: 161 lb 3.2 oz (73.1 kg)    Fetal Status: Fetal Heart Rate (bpm): 145 Fundal Height: 17 cm Movement: Absent     General:  Alert, oriented and cooperative. Patient is in no acute distress.  Skin: Skin is warm and dry. No rash noted.   Cardiovascular: Normal heart rate noted  Respiratory: Normal respiratory effort, no problems with respiration noted  Abdomen: Soft, gravid, appropriate for gestational age.  Pain/Pressure: Absent     Pelvic: Cervical exam deferred        Extremities: Normal range of motion.  Edema: None  Mental Status: Normal mood and affect. Normal behavior. Normal judgment and thought content.   Assessment and Plan:  Pregnancy: [id number] at [redacted]w[redacted]d  1. Supervision of high risk pregnancy, antepartum -31 year old female in clinic today for prenatal care.  -Patient states she is taking her PNV daily. -Patient reports frequent headaches.  Assessed for signs and symptoms of preeclampsia.  BP normal and no  other signs and symptoms reported.  Advised increase water intake to 6-8 water bottles and take Tylenol as needed.   -Patient declines AFP today and MaterniT21, patient declined.    2. UTI in pregnancy, antepartum 12/26/21 -Patient was diagnosed for UTI on 12/26/21 and came in for treatment on 01/03/22.   Patient reports completing medication regimen.  Will receive TOC today.   - Urine Culture  3. Overweight (BMI 25.0-29.9) -Patient encouraged to eat six small meals a day high protein low in fat and sugar.  Patient reports she exercised 3-4 times a week for at least 30 minutes.   -15 lb 3.2 oz (6.895 kg)    Term labor symptoms and general obstetric precautions including but not limited to vaginal bleeding, contractions, leaking of fluid and fetal movement were reviewed in detail with the patient. Please refer to After Visit Summary for other counseling recommendations.   Return in about 4 weeks (around 02/27/2022) for Routine prenatal care visit.  Due to a language barrier an interpreter Rowland Lathe) was used for the provider portion of the visit.      Gregary Cromer, FNP

## 2022-01-30 NOTE — Progress Notes (Signed)
Client reports completed antibiotic for UTI. Urine culture for TOC today. [facility] for [place] at [facility] on 01/04/2022 and RN will rescheduled this am. Declined any birth defect screening test today and quad declination form signed. Jossie Ng, RN Client with high risk pregnancy code and needs [place] ordered at [facility], not [facility]. Glenna Fellows FNP-BC aware and to change order in computer. Jossie Ng, RN

## 2022-01-30 NOTE — Addendum Note (Signed)
Addended by: Glenna Fellows on: 01/30/2022 09:29 PM   Modules accepted: Orders

## 2022-02-01 ENCOUNTER — Telehealth: Payer: Self-pay

## 2022-02-01 LAB — URINE CULTURE

## 2022-02-01 NOTE — Telephone Encounter (Signed)
Call to client with Salli Real interpreting and given [facility] [place] appt of 03/23/2022 at 0800 (arrive 0745). Client aware we will give her [facility] map at her RV appt 02/24/22. Jossie Ng, RN

## 2022-02-24 ENCOUNTER — Ambulatory Visit: Payer: Medicaid Other | Admitting: Physician Assistant

## 2022-02-24 ENCOUNTER — Encounter: Payer: Self-pay | Admitting: Physician Assistant

## 2022-02-24 VITALS — BP 101/66 | HR 77 | Temp 97.1°F | Wt 164.8 lb

## 2022-02-24 DIAGNOSIS — O0993 Supervision of high risk pregnancy, unspecified, third trimester: Secondary | ICD-10-CM | POA: Diagnosis not present

## 2022-02-24 DIAGNOSIS — O0990 Supervision of high risk pregnancy, unspecified, unspecified trimester: Secondary | ICD-10-CM

## 2022-02-24 DIAGNOSIS — E663 Overweight: Secondary | ICD-10-CM

## 2022-02-24 NOTE — Progress Notes (Addendum)
Patient here for MH RV at [redacted]w[redacted]d.   Patient aware of [place] on 03/23/22 at 0800 [facility] campus map given.   Earlyne Iba, RN

## 2022-02-24 NOTE — Progress Notes (Signed)
[facility] Department Maternal Health Clinic  PRENATAL VISIT NOTE  Subjective:  Kristine Blake is a 31 y.o. G3T5176 at [redacted]w[redacted]d being seen today for ongoing prenatal care.  She is currently monitored for the following issues for this high-risk pregnancy and has Herpes; History of postpartum hemorrhage  (2019); Overweight (BMI 25.0-29.9); Supervision of high risk pregnancy, antepartum; History of gestational diabetes 12/2018; and UTI in pregnancy, antepartum 12/26/21 on their problem list.  Patient reports headache.  Contractions: Not present. Vag. Bleeding: None.  Movement: Present. Headaches are improved from prior visit. Denies leaking of fluid/ROM.   The following portions of the patient's history were reviewed and updated as appropriate: allergies, current medications, past family history, past medical history, past social history, past surgical history and problem list. Problem list updated.  Objective:   Vitals:   02/24/22 0830  BP: 101/66  Pulse: 77  Temp: (!) 97.1 F (36.2 C)  Weight: 164 lb 12.8 oz (74.8 kg)    Fetal Status: Fetal Heart Rate (bpm): 152 Fundal Height: 21 cm Movement: Present     General:  Alert, oriented and cooperative. Patient is in no acute distress.  Skin: Skin is warm and dry. No rash noted.   Cardiovascular: Normal heart rate noted  Respiratory: Normal respiratory effort, no problems with respiration noted  Abdomen: Soft, gravid, appropriate for gestational age.  Pain/Pressure: Absent     Pelvic: Cervical exam deferred        Extremities: Normal range of motion.  Edema: None  Mental Status: Normal mood and affect. Normal behavior. Normal judgment and thought content.   Assessment and Plan:  Pregnancy: [id number] at [redacted]w[redacted]d  1. Supervision of high risk pregnancy, antepartum Continue current care. Reviewed U C&S results from prior visit with patient - UTI resolved after antibiotics. Enc to push po water in light of very hot weather. Enc  to keep fetal anat [place] as sched. Does not want to know sex of baby.  2. Overweight (BMI 25.0-29.9) TWG thus far >18 lb, over desired. Enc to limit portion sizes. Fluid choices already appropriate (mostly water).  Preterm labor symptoms and general obstetric precautions including but not limited to vaginal bleeding, contractions, leaking of fluid and fetal movement were reviewed in detail with the patient. Please refer to After Visit Summary for other counseling recommendations.  Due to language barrier, an interpreter (M. [place]) was present during the history-taking and subsequent discussion (and the physical exam) with this patient.  Return in about 4 weeks (around 03/24/2022) for Routine prenatal care.  Future Appointments  Date Time Provider Department Center  03/23/2022  8:00 AM [facility]  03/27/2022  8:20 AM [facility] PROVIDER [facility] None   Landry Dyke, PA-C

## 2022-03-23 ENCOUNTER — Other Ambulatory Visit: Payer: Self-pay

## 2022-03-23 ENCOUNTER — Ambulatory Visit: Payer: Self-pay | Attending: Obstetrics

## 2022-03-23 DIAGNOSIS — O359XX0 Maternal care for (suspected) fetal abnormality and damage, unspecified, not applicable or unspecified: Secondary | ICD-10-CM | POA: Insufficient documentation

## 2022-03-23 DIAGNOSIS — Z363 Encounter for antenatal screening for malformations: Secondary | ICD-10-CM | POA: Insufficient documentation

## 2022-03-23 DIAGNOSIS — Z3A24 24 weeks gestation of pregnancy: Secondary | ICD-10-CM | POA: Insufficient documentation

## 2022-03-23 DIAGNOSIS — O358XX0 Maternal care for other (suspected) fetal abnormality and damage, not applicable or unspecified: Secondary | ICD-10-CM

## 2022-03-23 DIAGNOSIS — O09292 Supervision of pregnancy with other poor reproductive or obstetric history, second trimester: Secondary | ICD-10-CM

## 2022-03-23 DIAGNOSIS — O0990 Supervision of high risk pregnancy, unspecified, unspecified trimester: Secondary | ICD-10-CM

## 2022-03-27 ENCOUNTER — Ambulatory Visit: Payer: Self-pay | Admitting: Advanced Practice Midwife

## 2022-03-27 VITALS — BP 97/68 | HR 88 | Temp 97.5°F | Wt 169.4 lb

## 2022-03-27 DIAGNOSIS — B009 Herpesviral infection, unspecified: Secondary | ICD-10-CM

## 2022-03-27 DIAGNOSIS — O0992 Supervision of high risk pregnancy, unspecified, second trimester: Secondary | ICD-10-CM

## 2022-03-27 DIAGNOSIS — O0990 Supervision of high risk pregnancy, unspecified, unspecified trimester: Secondary | ICD-10-CM

## 2022-03-27 MED ORDER — PRENATAL MULTIVITAMIN CH: 1.0000 | ORAL_TABLET | Freq: Every day | ORAL | 0 refills | Status: DC

## 2022-03-27 NOTE — Progress Notes (Signed)
[facility] Department Maternal Health Clinic  PRENATAL VISIT NOTE  Subjective:  Kristine Blake is a 31 y.o. [id number] at [redacted]w[redacted]d being seen today for ongoing prenatal care.  She is currently monitored for the following issues for this high-risk pregnancy and has Herpes; History of postpartum hemorrhage  (2019); Supervision of high risk pregnancy, antepartum; History of gestational diabetes 12/2018; and UTI in pregnancy, antepartum 12/26/21 on their problem list.  Patient reports no complaints.  Contractions: Not present. Vag. Bleeding: None.  Movement: Present. Denies leaking of fluid/ROM.   The following portions of the patient's history were reviewed and updated as appropriate: allergies, current medications, past family history, past medical history, past social history, past surgical history and problem list. Problem list updated.  Objective:   Vitals:   03/27/22 0813  BP: 97/68  Pulse: 88  Temp: (!) 97.5 F (36.4 C)  Weight: 169 lb 6.4 oz (76.8 kg)    Fetal Status: Fetal Heart Rate (bpm): 130 Fundal Height: 27 cm Movement: Present     General:  Alert, oriented and cooperative. Patient is in no acute distress.  Skin: Skin is warm and dry. No rash noted.   Cardiovascular: Normal heart rate noted  Respiratory: Normal respiratory effort, no problems with respiration noted  Abdomen: Soft, gravid, appropriate for gestational age.  Pain/Pressure: Absent     Pelvic: Cervical exam deferred        Extremities: Normal range of motion.  Edema: None  Mental Status: Normal mood and affect. Normal behavior. Normal judgment and thought content.   Assessment and Plan:  Pregnancy: W5I6270 at [redacted]w[redacted]d  1. Supervision of high risk pregnancy, antepartum 23 lb 6.4 oz (10.6 kg) Not working Walking 4-5x/wk x 30-40 min [facility] 01/04/22 u/s so rescheduled first u/s on 03/21/22 at 25 1/7 wks with anterior placenta, AFI wnl, EFW=56% Interested in BTL or Paraguard pp - Prenatal Vit-Fe  Fumarate-FA (PRENATAL MULTIVITAMIN) TABS tablet; Take 1 tablet by mouth daily at 12 noon.  Dispense: 100 tablet; Refill: 0  2. Herpes Needs prophylaxis tx at 36 wks   Preterm labor symptoms and general obstetric precautions including but not limited to vaginal bleeding, contractions, leaking of fluid and fetal movement were reviewed in detail with the patient. Please refer to After Visit Summary for other counseling recommendations.  Return in about 3 weeks (around 04/17/2022) for routine PNC, 28 week labs.  Future Appointments  Date Time Provider Department Center  04/17/2022  8:20 AM [facility] PROVIDER AC-[facility] None    Alberteen Spindle, CNM

## 2022-04-11 ENCOUNTER — Encounter: Payer: Self-pay | Admitting: Advanced Practice Midwife

## 2022-04-17 ENCOUNTER — Ambulatory Visit: Payer: Self-pay | Admitting: Advanced Practice Midwife

## 2022-04-17 VITALS — BP 99/67 | HR 83 | Temp 97.9°F | Wt 171.8 lb

## 2022-04-17 DIAGNOSIS — O0993 Supervision of high risk pregnancy, unspecified, third trimester: Secondary | ICD-10-CM

## 2022-04-17 DIAGNOSIS — O99810 Abnormal glucose complicating pregnancy: Secondary | ICD-10-CM | POA: Insufficient documentation

## 2022-04-17 DIAGNOSIS — O0990 Supervision of high risk pregnancy, unspecified, unspecified trimester: Secondary | ICD-10-CM

## 2022-04-17 DIAGNOSIS — Z23 Encounter for immunization: Secondary | ICD-10-CM

## 2022-04-17 DIAGNOSIS — B009 Herpesviral infection, unspecified: Secondary | ICD-10-CM

## 2022-04-17 LAB — HEMOGLOBIN, FINGERSTICK: Hemoglobin: 12.3 g/dL (ref 11.1–15.9)

## 2022-04-17 NOTE — Progress Notes (Signed)
Glucola drink completed at 0825. TDAP VIS given. In-House HGB WNL.B. Venetia Constable RN

## 2022-04-17 NOTE — Progress Notes (Signed)
[facility] Department Maternal Health Clinic  PRENATAL VISIT NOTE  Subjective:  Kristine Blake is a 31 y.o. [id number] at [redacted]w[redacted]d being seen today for ongoing prenatal care.  She is currently monitored for the following issues for this low-risk pregnancy and has Herpes; History of postpartum hemorrhage  (2019); Supervision of high risk pregnancy, antepartum; History of gestational diabetes 12/2018; and UTI in pregnancy, antepartum 12/26/21 on their problem list.  Patient reports no complaints.  Contractions: Not present. Vag. Bleeding: None.  Movement: Present. Denies leaking of fluid/ROM.   The following portions of the patient's history were reviewed and updated as appropriate: allergies, current medications, past family history, past medical history, past social history, past surgical history and problem list. Problem list updated.  Objective:   Vitals:   04/17/22 0815  BP: 99/67  Pulse: 83  Temp: 97.9 F (36.6 C)  Weight: 171 lb 12.8 oz (77.9 kg)    Fetal Status: Fetal Heart Rate (bpm): 140 Fundal Height: 28 cm Movement: Present     General:  Alert, oriented and cooperative. Patient is in no acute distress.  Skin: Skin is warm and dry. No rash noted.   Cardiovascular: Normal heart rate noted  Respiratory: Normal respiratory effort, no problems with respiration noted  Abdomen: Soft, gravid, appropriate for gestational age.  Pain/Pressure: Absent     Pelvic: Cervical exam deferred        Extremities: Normal range of motion.  Edema: None  Mental Status: Normal mood and affect. Normal behavior. Normal judgment and thought content.   Assessment and Plan:  Pregnancy: [id number] at [redacted]w[redacted]d  1. Supervision of high risk pregnancy, antepartum 25 lb 12.8 oz (11.7 kg) Not working Walking 3-4x/wk x 30 min 1 hour glucola today  - Glucose Tolerance, 1 Hour - HIV-1/HIV-2 Qualitative RNA - RPR - Hemoglobin, venipuncture  2. Herpes Needs prophylaxis tx at 36  wks   Preterm labor symptoms and general obstetric precautions including but not limited to vaginal bleeding, contractions, leaking of fluid and fetal movement were reviewed in detail with the patient. Please refer to After Visit Summary for other counseling recommendations.  Return in about 2 weeks (around 05/01/2022) for routine PNC.  No future appointments.  Alberteen Spindle, CNM

## 2022-04-18 LAB — HIV-1/HIV-2 QUALITATIVE RNA
HIV-1 RNA, Qualitative: NONREACTIVE
HIV-2 RNA, Qualitative: NONREACTIVE

## 2022-04-18 LAB — GLUCOSE TOLERANCE, 1 HOUR: Glucose, 1Hr PP: 162 mg/dL (ref 70–199)

## 2022-04-18 LAB — RPR: RPR Ser Ql: NONREACTIVE

## 2022-04-19 ENCOUNTER — Telehealth: Payer: Self-pay

## 2022-04-19 NOTE — Telephone Encounter (Signed)
Telephone call to patient today regarding her abnormal 1 HR GTT=162 and the need for a 3 HR GTT.  Patient needs to call back to reschedule her 3 HR GTT.  Patient aware to ask for a 3 HR GTT lab and to schedule for an early morning and to fast after midnight the night before.  Language Line used today.  Madelin Headings / [phone].  Hart Carwin, RN

## 2022-04-20 NOTE — Telephone Encounter (Signed)
Telephone call to patient today regarding the need to schedule a 3 HR GTT appointment.  3 HR GTT scheduled for 04-24-22 at 8:50 am (patient to arrive at 8 am).  Patient instructed to be NPO after midnight the night before.  She is aware she will be in the clinic most of the morning and her labs will be drawn every hour times 3 and she will be leaving near the lunch hour.  Language Line used today.  Margurite Auerbach / [phone].  Hart Carwin, RN

## 2022-04-24 ENCOUNTER — Other Ambulatory Visit: Payer: Self-pay

## 2022-04-24 DIAGNOSIS — O99810 Abnormal glucose complicating pregnancy: Secondary | ICD-10-CM

## 2022-04-24 NOTE — Progress Notes (Signed)
In nurse clinic for 3 hr GTT. Pt reports npo since last night between 9 pm and 10 pm. Instructions explained for test today. Pt plans to notify RN / staff  if vomits today as test will need to be rescheduled. Questions answered and reports understanding. Jerel Shepherd, RN

## 2022-04-25 ENCOUNTER — Other Ambulatory Visit: Payer: Self-pay | Admitting: Advanced Practice Midwife

## 2022-04-25 DIAGNOSIS — O99810 Abnormal glucose complicating pregnancy: Secondary | ICD-10-CM

## 2022-04-25 LAB — GLUCOSE TOLERANCE TEST, 6 HOUR
Glucose, 1 Hour GTT: 212 mg/dL — ABNORMAL HIGH (ref 70–199)
Glucose, 2 hour: 152 mg/dL — ABNORMAL HIGH (ref 70–139)
Glucose, 3 hour: 116 mg/dL — ABNORMAL HIGH (ref 70–109)
Glucose, GTT - Fasting: 93 mg/dL (ref 70–99)

## 2022-04-28 ENCOUNTER — Telehealth: Payer: Self-pay | Admitting: Family Medicine

## 2022-04-28 NOTE — Telephone Encounter (Signed)
Per client, message left on voicemail this am with request to return call. Client states she called back to [phone], but no one available. Number is from the [facility] and client recently had provider ordered MNT referral due to equivocal 3 hour GTT.  Client reminded of 0800 arrival time for [facility] RV appt 05/02/2022. Counseled to go to [facility] after appt on Tuesday for MNT referral / schedule appt. [employer] # [phone] assisted with call. Jossie Ng, RN

## 2022-04-28 NOTE — Telephone Encounter (Signed)
Pt returned the nurses call. She said she will be available at this number for the rest of this afternoon. [phone]

## 2022-05-02 ENCOUNTER — Ambulatory Visit: Payer: Self-pay | Admitting: Nurse Practitioner

## 2022-05-02 ENCOUNTER — Encounter: Payer: Self-pay | Admitting: Nurse Practitioner

## 2022-05-02 VITALS — BP 107/70 | HR 89 | Temp 97.7°F | Wt 169.8 lb

## 2022-05-02 DIAGNOSIS — O0990 Supervision of high risk pregnancy, unspecified, unspecified trimester: Secondary | ICD-10-CM

## 2022-05-02 DIAGNOSIS — O0993 Supervision of high risk pregnancy, unspecified, third trimester: Secondary | ICD-10-CM

## 2022-05-02 DIAGNOSIS — B009 Herpesviral infection, unspecified: Secondary | ICD-10-CM

## 2022-05-02 NOTE — Progress Notes (Signed)
[facility] Department Maternal Health Clinic  PRENATAL VISIT NOTE  Subjective:  Kristine Blake is a 31 y.o. M0E0223 at [redacted]w[redacted]d being seen today for ongoing prenatal care.  She is currently monitored for the following issues for this low-risk pregnancy and has Herpes; History of postpartum hemorrhage  (2019); Supervision of high risk pregnancy, antepartum; History of gestational diabetes 12/2018; UTI in pregnancy, antepartum 12/26/21; and Abnormal glucose tolerance test in pregnancy on their problem list.  Patient reports no complaints.  Contractions: Not present. Vag. Bleeding: None.  Movement: Present. Denies leaking of fluid/ROM.   The following portions of the patient's history were reviewed and updated as appropriate: allergies, current medications, past family history, past medical history, past social history, past surgical history and problem list. Problem list updated.  Objective:   Vitals:   05/02/22 0817  BP: 107/70  Pulse: 89  Temp: 97.7 F (36.5 C)  Weight: 169 lb 12.8 oz (77 kg)    Fetal Status: Fetal Heart Rate (bpm): 145 Fundal Height: 29 cm Movement: Present     General:  Alert, oriented and cooperative. Patient is in no acute distress.  Skin: Skin is warm and dry. No rash noted.   Cardiovascular: Normal heart rate noted  Respiratory: Normal respiratory effort, no problems with respiration noted  Abdomen: Soft, gravid, appropriate for gestational age.  Pain/Pressure: Absent     Pelvic: Cervical exam deferred        Extremities: Normal range of motion.  Edema: None  Mental Status: Normal mood and affect. Normal behavior. Normal judgment and thought content.   Assessment and Plan:  Pregnancy: V6P2244 at [redacted]w[redacted]d  1. Supervision of high risk pregnancy, antepartum -31 year old female in clinic for prenatal care. -Patient states she takes PNV daily. -ROS reviewed.  No complaints noted. -23 lb 12.8 oz (10.8 kg)   2. Herpes -HSV suppressive  treatment initiated at 36 weeks.   Due to a language barrier an interpreter (language line [phone]) was used for the provider portion of the visit.     Term labor symptoms and general obstetric precautions including but not limited to vaginal bleeding, contractions, leaking of fluid and fetal movement were reviewed in detail with the patient. Please refer to After Visit Summary for other counseling recommendations.   Return in about 2 weeks (around 05/16/2022) for Routine prenatal care visit.  Future Appointments  Date Time Provider Department Center  05/15/2022  8:20 AM [facility] PROVIDER [facility] None    Glenna Fellows, FNP

## 2022-05-02 NOTE — Progress Notes (Signed)
Pt presents today at 30.3 weeks; denies any issues at this time; states going to [facility] after her appt today.Florina Ou, RN

## 2022-05-08 NOTE — Addendum Note (Signed)
Addended by: Heywood Bene on: 05/08/2022 11:51 AM   Modules accepted: Orders

## 2022-05-15 ENCOUNTER — Ambulatory Visit: Payer: Self-pay

## 2022-05-15 ENCOUNTER — Telehealth: Payer: Self-pay

## 2022-05-15 NOTE — Telephone Encounter (Signed)
Patient called clinic to rescheduled missed appointment today. Patient rescheduled for 05/23/2022 Tuesday at 0840 arrival time.   Patient states she had to cancel appt due to being sick with covid. Her sx started Friday 05/12/22 and she tested positive for covid Saturday 05/13/22. She states she has been taking Tylenol and feeling the same with congestion and cold-like sx.   Reports positive fetal movement and denies labor sx. Isolation precautions given. Encouraged patient to reviewed "Safe Medications for Pregnancy" list given to her during IP visit.  Montgomery Rothlisberger Ramos, RN  

## 2022-05-23 ENCOUNTER — Ambulatory Visit: Payer: Self-pay | Admitting: Advanced Practice Midwife

## 2022-05-23 VITALS — BP 101/65 | HR 73 | Temp 97.4°F | Wt 168.0 lb

## 2022-05-23 DIAGNOSIS — O99810 Abnormal glucose complicating pregnancy: Secondary | ICD-10-CM

## 2022-05-23 DIAGNOSIS — O0993 Supervision of high risk pregnancy, unspecified, third trimester: Secondary | ICD-10-CM

## 2022-05-23 DIAGNOSIS — O0990 Supervision of high risk pregnancy, unspecified, unspecified trimester: Secondary | ICD-10-CM

## 2022-05-23 DIAGNOSIS — Z8632 Personal history of gestational diabetes: Secondary | ICD-10-CM

## 2022-05-23 NOTE — Progress Notes (Signed)
Patient here for MH RV at 33 3/7. Kick counts reviewed with client and cards given. Patient states she had a phone call from nutritionist and will go to [facility] today to make an appointment. Declines flu vaccine.Jenetta Downer, RN

## 2022-05-23 NOTE — Progress Notes (Signed)
[facility] Department Maternal Health Clinic  PRENATAL VISIT NOTE  Subjective:  Kristine Blake is a 31 y.o. M3N3614 at [redacted]w[redacted]d being seen today for ongoing prenatal care.  She is currently monitored for the following issues for this high-risk pregnancy and has Herpes; History of postpartum hemorrhage  (2019); Supervision of high risk pregnancy, antepartum; History of gestational diabetes 12/2018; UTI in pregnancy, antepartum 12/26/21; and Abnormal glucose tolerance test in pregnancy on their problem list.  Patient reports no complaints.  Contractions: Not present. Vag. Bleeding: None.  Movement: Present. Denies leaking of fluid/ROM.   The following portions of the patient's history were reviewed and updated as appropriate: allergies, current medications, past family history, past medical history, past social history, past surgical history and problem list. Problem list updated.  Objective:   Vitals:   05/23/22 0848  BP: 101/65  Pulse: 73  Temp: (!) 97.4 F (36.3 C)  Weight: 168 lb (76.2 kg)    Fetal Status: Fetal Heart Rate (bpm): 135 Fundal Height: 33 cm Movement: Present     General:  Alert, oriented and cooperative. Patient is in no acute distress.  Skin: Skin is warm and dry. No rash noted.   Cardiovascular: Normal heart rate noted  Respiratory: Normal respiratory effort, no problems with respiration noted  Abdomen: Soft, gravid, appropriate for gestational age.  Pain/Pressure: Absent     Pelvic: Cervical exam deferred        Extremities: Normal range of motion.  Edema: None  Mental Status: Normal mood and affect. Normal behavior. Normal judgment and thought content.   Assessment and Plan:  Pregnancy: E3X5400 at [redacted]w[redacted]d  1. Supervision of high risk pregnancy, antepartum Not working Not exercising this week Has car seat, crib 22 lb (9.979 kg)   2. Abnormal glucose tolerance test in pregnancy 3 hour GTT 04/24/22=equivocal Nutritionist called pt for ADA  diet counseling but pt did not pick up phone so going to [facility] now to schedule apt  3. History of gestational diabetes 12/2018    Preterm labor symptoms and general obstetric precautions including but not limited to vaginal bleeding, contractions, leaking of fluid and fetal movement were reviewed in detail with the patient. Please refer to After Visit Summary for other counseling recommendations.  Return in about 2 weeks (around 06/06/2022) for routine PNC.  No future appointments.  Herbie Saxon, CNM

## 2022-05-26 ENCOUNTER — Ambulatory Visit: Payer: Self-pay | Admitting: Professional Counselor

## 2022-06-07 ENCOUNTER — Ambulatory Visit: Payer: Self-pay | Admitting: Advanced Practice Midwife

## 2022-06-07 VITALS — BP 104/67 | HR 76 | Temp 97.7°F | Wt 169.6 lb

## 2022-06-07 DIAGNOSIS — O0993 Supervision of high risk pregnancy, unspecified, third trimester: Secondary | ICD-10-CM

## 2022-06-07 DIAGNOSIS — B009 Herpesviral infection, unspecified: Secondary | ICD-10-CM

## 2022-06-07 DIAGNOSIS — Z8632 Personal history of gestational diabetes: Secondary | ICD-10-CM

## 2022-06-07 DIAGNOSIS — O99810 Abnormal glucose complicating pregnancy: Secondary | ICD-10-CM

## 2022-06-07 DIAGNOSIS — O0990 Supervision of high risk pregnancy, unspecified, unspecified trimester: Secondary | ICD-10-CM

## 2022-06-07 NOTE — Progress Notes (Signed)
[facility] Department Maternal Health Clinic  PRENATAL VISIT NOTE  Subjective:  Kristine Blake is a 31 y.o. [id number] at [redacted]w[redacted]d being seen today for ongoing prenatal care.  She is currently monitored for the following issues for this high-risk pregnancy and has Herpes; History of postpartum hemorrhage  (2019); Supervision of high risk pregnancy, antepartum; History of gestational diabetes 12/2018; UTI in pregnancy, antepartum 12/26/21; and Abnormal glucose tolerance test in pregnancy on their problem list.  Patient reports no complaints.  Contractions: Not present. Vag. Bleeding: None.  Movement: Present. Denies leaking of fluid/ROM.   The following portions of the patient's history were reviewed and updated as appropriate: allergies, current medications, past family history, past medical history, past social history, past surgical history and problem list. Problem list updated.  Objective:   Vitals:   06/07/22 0823  BP: 104/67  Pulse: 76  Temp: 97.7 F (36.5 C)  Weight: 169 lb 9.6 oz (76.9 kg)    Fetal Status: Fetal Heart Rate (bpm): 132 Fundal Height: 34 cm Movement: Present     General:  Alert, oriented and cooperative. Patient is in no acute distress.  Skin: Skin is warm and dry. No rash noted.   Cardiovascular: Normal heart rate noted  Respiratory: Normal respiratory effort, no problems with respiration noted  Abdomen: Soft, gravid, appropriate for gestational age.  Pain/Pressure: Absent     Pelvic: Cervical exam deferred        Extremities: Normal range of motion.  Edema: None  Mental Status: Normal mood and affect. Normal behavior. Normal judgment and thought content.   Assessment and Plan:  Pregnancy: F8H8299 at [redacted]w[redacted]d  1. Herpes Rx given for Valacyclovir 500 mg BID beginning 36 wks until birth  2. Supervision of high risk pregnancy, antepartum 23 lb 9.6 oz (10.7 kg) Not working Walking 3x/wk x 20-30 min Has car seat and crib Knows when to go to  L&D  - Chlamydia/GC NAA, Confirmation  3. Abnormal glucose tolerance test in pregnancy Equivocal 3 hour GTT with ADA diet counseling done 05/23/22  4. History of gestational diabetes 12/2018    Preterm labor symptoms and general obstetric precautions including but not limited to vaginal bleeding, contractions, leaking of fluid and fetal movement were reviewed in detail with the patient. Please refer to After Visit Summary for other counseling recommendations.  Return in about 1 week (around 06/14/2022) for routine PNC.  No future appointments.  Herbie Saxon, CNM

## 2022-06-07 NOTE — Progress Notes (Signed)
Patient self collected GC/CL specimen without difficulty. [facility] Interpretor utilized Verdene Lennert).   36 week questionnaires administer and packet given. Patient states she had ADA Diet counseling. BThiele RN

## 2022-06-09 LAB — CHLAMYDIA/GC NAA, CONFIRMATION
Chlamydia trachomatis, NAA: NEGATIVE
Neisseria gonorrhoeae, NAA: NEGATIVE

## 2022-06-16 ENCOUNTER — Encounter: Payer: Self-pay | Admitting: Nurse Practitioner

## 2022-06-16 ENCOUNTER — Ambulatory Visit: Payer: Self-pay | Admitting: Nurse Practitioner

## 2022-06-16 VITALS — BP 109/69 | HR 81 | Temp 98.5°F | Wt 167.4 lb

## 2022-06-16 DIAGNOSIS — O0993 Supervision of high risk pregnancy, unspecified, third trimester: Secondary | ICD-10-CM

## 2022-06-16 DIAGNOSIS — B009 Herpesviral infection, unspecified: Secondary | ICD-10-CM

## 2022-06-16 DIAGNOSIS — O0990 Supervision of high risk pregnancy, unspecified, unspecified trimester: Secondary | ICD-10-CM

## 2022-06-16 NOTE — Progress Notes (Signed)
Patient here for MH RV at 23 6/7. Had GC/Chlamydia testing last week, results negative. States she has car seat ready.Jenetta Downer, RN

## 2022-06-16 NOTE — Progress Notes (Signed)
[facility] Department Maternal Health Clinic  PRENATAL VISIT NOTE  Subjective:  Kristine Blake is a 31 y.o. [id number] at [redacted]w[redacted]d being seen today for ongoing prenatal care.  She is currently monitored for the following issues for this high-risk pregnancy and has Herpes; History of postpartum hemorrhage  (2019); Supervision of high risk pregnancy, antepartum; History of gestational diabetes 12/2018; UTI in pregnancy, antepartum 12/26/21 (Group B Strep); and Abnormal glucose tolerance test in pregnancy on their problem list.  Patient reports  leg pain in the morning .  Contractions: Irritability. Vag. Bleeding: None.  Movement: Present. Denies leaking of fluid/ROM.   The following portions of the patient's history were reviewed and updated as appropriate: allergies, current medications, past family history, past medical history, past social history, past surgical history and problem list. Problem list updated.  Objective:   Vitals:   06/16/22 1438  BP: 109/69  Pulse: 81  Temp: 98.5 F (36.9 C)  Weight: 167 lb 6.4 oz (75.9 kg)    Fetal Status: Fetal Heart Rate (bpm): 145 Fundal Height: 36 cm Movement: Present  Presentation: Vertex  General:  Alert, oriented and cooperative. Patient is in no acute distress.  Skin: Skin is warm and dry. No rash noted.   Cardiovascular: Normal heart rate noted  Respiratory: Normal respiratory effort, no problems with respiration noted  Abdomen: Soft, gravid, appropriate for gestational age.  Pain/Pressure: Present     Pelvic: Cervical exam deferred        Extremities: Normal range of motion.  Edema: None  Mental Status: Normal mood and affect. Normal behavior. Normal judgment and thought content.   Assessment and Plan:  Pregnancy: S0F0932 at [redacted]w[redacted]d  1. Supervision of high risk pregnancy, antepartum -31 year old female in clinic today for prenatal exam.  -ROS reviewed, patient with complaints of leg pain upon waking up.  Patient  reports leg pain resolves after she starts moving.  Advised patient to incorporating stretching exercises to minimize pain.     -Patient taking PNV daily.  -36 week labs collected last week, along with packet of information.  CT/GC negative, Group B Strep identified at 12/26/21 urine culture. -Reviewed with patient when to report to the hospital for delivery. -21 lb 6.4 oz (9.707 kg)   2. Herpes -Patient reports that she is taking Valtrex 500 MG BID.  Patient to take until delivery.    Term labor symptoms and general obstetric precautions including but not limited to vaginal bleeding, contractions, leaking of fluid and fetal movement were reviewed in detail with the patient. Please refer to After Visit Summary for other counseling recommendations.   Return in about 1 week (around 06/23/2022) for Routine prenatal care visit.  Future Appointments  Date Time Provider [place]  06/23/2022  8:20 AM [facility] PROVIDER [facility] None    Gregary Cromer, FNP

## 2022-06-23 ENCOUNTER — Encounter: Payer: Self-pay | Admitting: Nurse Practitioner

## 2022-06-23 ENCOUNTER — Ambulatory Visit: Payer: Self-pay | Admitting: Nurse Practitioner

## 2022-06-23 VITALS — BP 105/71 | HR 72 | Temp 97.3°F | Wt 169.4 lb

## 2022-06-23 DIAGNOSIS — B009 Herpesviral infection, unspecified: Secondary | ICD-10-CM

## 2022-06-23 DIAGNOSIS — O0993 Supervision of high risk pregnancy, unspecified, third trimester: Secondary | ICD-10-CM

## 2022-06-23 DIAGNOSIS — O0990 Supervision of high risk pregnancy, unspecified, unspecified trimester: Secondary | ICD-10-CM

## 2022-06-23 NOTE — Progress Notes (Signed)
[facility] Department [facility] Clinic  PRENATAL VISIT NOTE  Subjective:  Tiena Manansala is a 31 y.o. Q6P6195 at [redacted]w[redacted]d being seen today for ongoing prenatal care.  She is currently monitored for the following issues for this high-risk pregnancy and has Herpes; History of postpartum hemorrhage  (2019); Supervision of high risk pregnancy, antepartum; History of gestational diabetes 12/2018; UTI in pregnancy, antepartum 12/26/21 (Group B Strep); and Abnormal glucose tolerance test in pregnancy on their problem list.  Patient reports some leg pain that comes can goes after walking.  Contractions: Irritability. Vag. Bleeding: None.  Movement: Present. Denies leaking of fluid/ROM.   The following portions of the patient's history were reviewed and updated as appropriate: allergies, current medications, past family history, past medical history, past social history, past surgical history and problem list. Problem list updated.  Objective:   Vitals:   06/23/22 0815  BP: 105/71  Pulse: 72  Temp: (!) 97.3 F (36.3 C)  Weight: 169 lb 6.4 oz (76.8 kg)    Fetal Status: Fetal Heart Rate (bpm): 130 Fundal Height: 36 cm Movement: Present  Presentation: Vertex  General:  Alert, oriented and cooperative. Patient is in no acute distress.  Skin: Skin is warm and dry. No rash noted.   Cardiovascular: Normal heart rate noted  Respiratory: Normal respiratory effort, no problems with respiration noted  Abdomen: Soft, gravid, appropriate for gestational age.  Pain/Pressure: Present     Pelvic: Cervical exam deferred        Extremities: Normal range of motion.  Edema: None  Mental Status: Normal mood and affect. Normal behavior. Normal judgment and thought content.   Assessment and Plan:  Pregnancy: [id number] at [redacted]w[redacted]d  1. Supervision of high risk pregnancy, antepartum -31 year old female in clinic today for a prenatal exam. -ROS reviewed, patient reports some leg pain while  walking.  Patient states after she begins to move around, the pain decreases.  Will continue to monitor.  -Patient reports taking PNV daily. -23 lb 6.4 oz (10.6 kg)   2. Herpes -Patient reports that she is taking Valtrex 500 MG BID.  Patient to take until delivery.     Term labor symptoms and general obstetric precautions including but not limited to vaginal bleeding, contractions, leaking of fluid and fetal movement were reviewed in detail with the patient. Please refer to After Visit Summary for other counseling recommendations.   Due to a language barrier an interpreter (language line [id number]) was used for the provider portion of the visit.     Return in about 1 week (around 06/30/2022) for Routine prenatal care visit.    Gregary Cromer, FNP

## 2022-06-26 ENCOUNTER — Other Ambulatory Visit: Payer: Self-pay

## 2022-06-26 ENCOUNTER — Inpatient Hospital Stay
Admission: EM | Admit: 2022-06-26 | Discharge: 2022-06-28 | DRG: 806 | Disposition: A | Discharge status: Home / Self Care | Payer: Medicaid Other | Attending: Obstetrics and Gynecology | Admitting: Obstetrics and Gynecology

## 2022-06-26 ENCOUNTER — Encounter: Payer: Self-pay | Admitting: Obstetrics and Gynecology

## 2022-06-26 DIAGNOSIS — O9081 Anemia of the puerperium: Secondary | ICD-10-CM | POA: Diagnosis not present

## 2022-06-26 DIAGNOSIS — D62 Acute posthemorrhagic anemia: Secondary | ICD-10-CM | POA: Diagnosis not present

## 2022-06-26 DIAGNOSIS — Z3A38 38 weeks gestation of pregnancy: Secondary | ICD-10-CM | POA: Diagnosis not present

## 2022-06-26 DIAGNOSIS — O99214 Obesity complicating childbirth: Principal | ICD-10-CM | POA: Diagnosis present

## 2022-06-26 DIAGNOSIS — O9832 Other infections with a predominantly sexual mode of transmission complicating childbirth: Secondary | ICD-10-CM | POA: Diagnosis present

## 2022-06-26 DIAGNOSIS — O26893 Other specified pregnancy related conditions, third trimester: Secondary | ICD-10-CM | POA: Diagnosis present

## 2022-06-26 DIAGNOSIS — A6000 Herpesviral infection of urogenital system, unspecified: Secondary | ICD-10-CM | POA: Diagnosis present

## 2022-06-26 DIAGNOSIS — O99810 Abnormal glucose complicating pregnancy: Principal | ICD-10-CM

## 2022-06-26 LAB — CBC
HCT: 39.5 % (ref 36.0–46.0)
Hemoglobin: 13.2 g/dL (ref 12.0–15.0)
MCH: 26.9 pg (ref 26.0–34.0)
MCHC: 33.4 g/dL (ref 30.0–36.0)
MCV: 80.6 fL (ref 80.0–100.0)
Platelets: 300 10*3/uL (ref 150–400)
RBC: 4.9 MIL/uL (ref 3.87–5.11)
RDW: 15.5 % (ref 11.5–15.5)
WBC: 11.6 10*3/uL — ABNORMAL HIGH (ref 4.0–10.5)
nRBC: 0 % (ref 0.0–0.2)

## 2022-06-26 LAB — TYPE AND SCREEN
ABO/RH(D): A POS
Antibody Screen: NEGATIVE

## 2022-06-26 MED ORDER — LACTATED RINGERS IV SOLN: INTRAVENOUS | Status: DC

## 2022-06-26 MED ORDER — LACTATED RINGERS IV SOLN: 500.0000 mL | INTRAVENOUS | Status: DC | PRN

## 2022-06-26 MED ORDER — SODIUM CHLORIDE 0.9% FLUSH
3.0000 mL | Freq: Two times a day (BID) | INTRAVENOUS | Status: DC
  Administered 2022-06-26: 3 mL via INTRAVENOUS

## 2022-06-26 MED ORDER — OXYTOCIN 10 UNIT/ML IJ SOLN
INTRAMUSCULAR | Status: AC
  Filled 2022-06-26: qty 1

## 2022-06-26 MED ORDER — COCONUT OIL OIL: 1.0000 | TOPICAL_OIL | Status: DC | PRN

## 2022-06-26 MED ORDER — PRENATAL MULTIVITAMIN CH
1.0000 | ORAL_TABLET | Freq: Every day | ORAL | Status: DC
  Administered 2022-06-26 – 2022-06-27 (×2): 1 via ORAL
  Filled 2022-06-26 (×2): qty 1

## 2022-06-26 MED ORDER — ACETAMINOPHEN 500 MG PO TABS
1000.0000 mg | ORAL_TABLET | Freq: Four times a day (QID) | ORAL | Status: DC
  Administered 2022-06-26 – 2022-06-28 (×7): 1000 mg via ORAL
  Filled 2022-06-26 (×7): qty 2

## 2022-06-26 MED ORDER — SIMETHICONE 80 MG PO CHEW: 80.0000 mg | CHEWABLE_TABLET | ORAL | Status: DC | PRN

## 2022-06-26 MED ORDER — MISOPROSTOL 200 MCG PO TABS
ORAL_TABLET | ORAL | Status: AC
  Filled 2022-06-26: qty 4

## 2022-06-26 MED ORDER — SENNOSIDES-DOCUSATE SODIUM 8.6-50 MG PO TABS
2.0000 | ORAL_TABLET | Freq: Every day | ORAL | Status: DC
  Administered 2022-06-27 – 2022-06-28 (×2): 2 via ORAL
  Filled 2022-06-26 (×2): qty 2

## 2022-06-26 MED ORDER — OXYTOCIN-SODIUM CHLORIDE 30-0.9 UT/500ML-% IV SOLN
2.5000 [IU]/h | INTRAVENOUS | Status: DC
  Administered 2022-06-26: 2.5 [IU]/h via INTRAVENOUS

## 2022-06-26 MED ORDER — SODIUM CHLORIDE 0.9% FLUSH: 3.0000 mL | INTRAVENOUS | Status: DC | PRN

## 2022-06-26 MED ORDER — AMMONIA AROMATIC IN INHA
RESPIRATORY_TRACT | Status: AC
  Filled 2022-06-26: qty 10

## 2022-06-26 MED ORDER — SODIUM CHLORIDE 0.9 % IV SOLN
2.0000 g | Freq: Once | INTRAVENOUS | Status: AC
  Administered 2022-06-26: 2 g via INTRAVENOUS
  Filled 2022-06-26: qty 2000

## 2022-06-26 MED ORDER — ONDANSETRON HCL 4 MG/2ML IJ SOLN: 4.0000 mg | INTRAMUSCULAR | Status: DC | PRN

## 2022-06-26 MED ORDER — ACETAMINOPHEN 325 MG PO TABS: 650.0000 mg | ORAL_TABLET | ORAL | Status: DC | PRN

## 2022-06-26 MED ORDER — ONDANSETRON HCL 4 MG PO TABS: 4.0000 mg | ORAL_TABLET | ORAL | Status: DC | PRN

## 2022-06-26 MED ORDER — OXYTOCIN BOLUS FROM INFUSION
333.0000 mL | Freq: Once | INTRAVENOUS | Status: AC
  Administered 2022-06-26: 333 mL via INTRAVENOUS

## 2022-06-26 MED ORDER — IBUPROFEN 600 MG PO TABS
600.0000 mg | ORAL_TABLET | Freq: Four times a day (QID) | ORAL | Status: DC
  Administered 2022-06-26 – 2022-06-27 (×3): 600 mg via ORAL
  Filled 2022-06-26 (×3): qty 1

## 2022-06-26 MED ORDER — ONDANSETRON HCL 4 MG/2ML IJ SOLN: 4.0000 mg | Freq: Four times a day (QID) | INTRAMUSCULAR | Status: DC | PRN

## 2022-06-26 MED ORDER — LIDOCAINE HCL (PF) 1 % IJ SOLN
INTRAMUSCULAR | Status: AC
  Filled 2022-06-26: qty 30

## 2022-06-26 MED ORDER — SOD CITRATE-CITRIC ACID 500-334 MG/5ML PO SOLN: 30.0000 mL | ORAL | Status: DC | PRN

## 2022-06-26 MED ORDER — LIDOCAINE HCL (PF) 1 % IJ SOLN: 30.0000 mL | INTRAMUSCULAR | Status: DC | PRN

## 2022-06-26 MED ORDER — SODIUM CHLORIDE 0.9 % IV SOLN: 1.0000 g | INTRAVENOUS | Status: DC

## 2022-06-26 MED ORDER — DIPHENHYDRAMINE HCL 25 MG PO CAPS: 25.0000 mg | ORAL_CAPSULE | Freq: Four times a day (QID) | ORAL | Status: DC | PRN

## 2022-06-26 MED ORDER — METHYLERGONOVINE MALEATE 0.2 MG/ML IJ SOLN
INTRAMUSCULAR | Status: AC
  Administered 2022-06-26: 0.2 mg
  Filled 2022-06-26: qty 1

## 2022-06-26 MED ORDER — ZOLPIDEM TARTRATE 5 MG PO TABS: 5.0000 mg | ORAL_TABLET | Freq: Every evening | ORAL | Status: DC | PRN

## 2022-06-26 MED ORDER — DIBUCAINE (PERIANAL) 1 % EX OINT
1.0000 | TOPICAL_OINTMENT | CUTANEOUS | Status: DC | PRN
  Administered 2022-06-26: 1 via RECTAL
  Filled 2022-06-26: qty 28

## 2022-06-26 MED ORDER — OXYCODONE-ACETAMINOPHEN 5-325 MG PO TABS: 2.0000 | ORAL_TABLET | ORAL | Status: DC | PRN

## 2022-06-26 MED ORDER — FERROUS SULFATE 325 (65 FE) MG PO TABS
325.0000 mg | ORAL_TABLET | Freq: Two times a day (BID) | ORAL | Status: DC
  Administered 2022-06-26 – 2022-06-28 (×4): 325 mg via ORAL
  Filled 2022-06-26 (×4): qty 1

## 2022-06-26 MED ORDER — OXYCODONE-ACETAMINOPHEN 5-325 MG PO TABS
1.0000 | ORAL_TABLET | ORAL | Status: DC | PRN
  Administered 2022-06-26: 1 via ORAL
  Filled 2022-06-26: qty 1

## 2022-06-26 MED ORDER — OXYTOCIN-SODIUM CHLORIDE 30-0.9 UT/500ML-% IV SOLN
INTRAVENOUS | Status: AC
  Filled 2022-06-26: qty 500

## 2022-06-26 MED ORDER — BENZOCAINE-MENTHOL 20-0.5 % EX AERO
1.0000 | INHALATION_SPRAY | CUTANEOUS | Status: DC | PRN
  Administered 2022-06-26: 1 via TOPICAL
  Filled 2022-06-26: qty 56

## 2022-06-26 MED ORDER — SODIUM CHLORIDE 0.9 % IV SOLN: 250.0000 mL | INTRAVENOUS | Status: DC | PRN

## 2022-06-26 MED ORDER — WITCH HAZEL-GLYCERIN EX PADS
1.0000 | MEDICATED_PAD | CUTANEOUS | Status: DC | PRN
  Administered 2022-06-26: 1 via TOPICAL
  Filled 2022-06-26: qty 100

## 2022-06-26 NOTE — H&P (Signed)
OB History & Physical   History of Present Illness:  Chief Complaint:   HPI:  Kristine Blake is a 31 y.o. [id number] female at [redacted]w[redacted]d dated by u/s on 01/04/22.  She presents to L&D for active labor  She reports:  -active fetal movement -LOF/SROM at 0500 -no vaginal bleeding -onset of contractions at 0300  Pregnancy Issues: 1. Obesity 2. History of PPH 3. History of GDM 4. Herpes   Maternal Medical History:   Past Medical History:  Diagnosis Date   Anemia    prior pregnancy   Dental neglect    Gestational diabetes    states gestational diabetes with 3 pregnancies   Heartburn    during pregnancy   HSV infection    Type A blood, Rh positive 03/06/2019   Varicosities of leg    inner thighs    Past Surgical History:  Procedure Laterality Date   denies surgical hx     NO PAST SURGERIES      No Known Allergies  Prior to Admission medications   Medication Sig Start Date End Date Taking? Authorizing Provider  Prenatal Vit-Fe Fumarate-FA (PRENATAL MULTIVITAMIN) TABS tablet Take 1 tablet by mouth daily at 12 noon. 03/27/22   Sciora, Real Cons, CNM  valACYclovir (VALTREX) 500 MG tablet Take 500 mg by mouth 2 (two) times daily.    [provider]     Prenatal care site: [facility] Dept   Social History: She  reports that she has never smoked. She has never been exposed to tobacco smoke. She has never used smokeless tobacco. She reports that she does not currently use alcohol. She reports that she does not use drugs.  Family History: family history is not on file.   Review of Systems: A full review of systems was performed and negative except as noted in the HPI.    Physical Exam:  Vital Signs: LMP 10/01/2021 (Exact Date)   General:   alert and cooperative  Skin:  normal  Neurologic:    Alert & oriented x 3  Lungs:    Nl effort  Heart:   regular rate and rhythm  Abdomen:   Gravid, soft between UCs  Extremities: :  non-tender, symmetric, no edema bilaterally.       Pertinent Results:  Prenatal Labs: Blood type/Rh A pos  Antibody screen neg  Rubella Immune  Varicella Immune  RPR NR  HBsAg Neg  HIV NR  GC neg  Chlamydia neg  Genetic screening declined  1 hour GTT 162  3 hour GTT Equivocal   GBS Pos by urine   FHT: FHR: 145 bpm, variability: moderate,  accelerations:  Abscent,  decelerations:  Absent Category/reactivity:  Category I TOCO: regular, every 2 minutes SVE:   /   /       Assessment:  Kristine Blake is a 31 y.o. [phone] female at [redacted]w[redacted]d with active labor.   Plan:  1. Admit to Labor & Delivery; consents reviewed and obtained  2. Fetal Well being  - Fetal Tracing: Cat I - GBS pos - Presentation: vtx confirmed by sve   3. Routine OB: - Prenatal labs reviewed, as above - Rh pos - CBC & T&S on admit - Clear fluids, IVF  4. Monitoring of Labor -  Contractions by external toco in place -  Pelvis proven to 3315g -  Plan for continuous fetal monitoring  -  Maternal pain control as desired: IVPM, nitrous, regional anesthesia - Anticipate vaginal  delivery  5. Post Partum Planning: - Infant feeding: Both - Contraception: OC - Tdap: given 04/17/22 - Flu: declined 05/23/22  [name], CNM 06/26/2022 7:15 AM

## 2022-06-26 NOTE — OB Triage Note (Signed)
Patient is a 31 yo, G6P4, at 38 weeks 2 days. Patient presents with complaints of ctx every 3-4 min. Patient denies any vaginal bleeding. Patient reports +FM. Monitors applied and assessing. VSS. Initial fetal heart tone 140. Pt grimacing through ctx and reports slight rectal pressure. SVE 8/100/-1. Membranes not intact upon exam, pt reports her water may have broken around 0500 today. Oxley, CNM notified of patients arrival to unit. Plan to move to move to labor room for admission.

## 2022-06-26 NOTE — Discharge Instructions (Addendum)
Discharge Instructions:   Follow-up Appointment: Call and schedule a follow-up appointment for a visit in 6-weeks at [facility]!   If there are any new medications, they have been ordered and will be available for pickup at the listed pharmacy on your way home from the hospital.   Call office if you have any of the following: headache, visual changes, fever >101.0 F, chills, shortness of breath, breast concerns, excessive vaginal bleeding, incision drainage or problems, leg pain or redness, depression or any other concerns. If you have vaginal discharge with an odor, let your doctor know.   It is normal to bleed for up to 6 weeks. You should not soak through more than 1 pad in 1 hour. If you have a blood clot larger than your fist with continued bleeding, call your doctor.   Activity: Do not lift > 10 lbs for 6 weeks (do not lift anything heavier than your baby). No intercourse, tampons, swimming pools, hot tubs, baths (only showers) for 6 weeks.  No driving for 1-2 weeks. Continue prenatal vitamin, especially if breastfeeding. Increase calories and fluids (water) while breastfeeding.   Your milk will come in, in the next couple of days (right now it is colostrum). You may have a slight fever when your milk comes in, but it should go away on its own.  If it does not, and rises above 101 F please call the doctor. You will also feel achy and your breasts will be firm. They will also start to leak. If you are breastfeeding, continue as you have been and you can pump/express milk for comfort.   If you have too much milk, your breasts can become engorged, which could lead to mastitis. This is an infection of the milk ducts. It can be very painful and you will need to notify your doctor to obtain a prescription for antibiotics. You can also treat it with a shower or hot/cold compress.   For concerns about your baby, please call your pediatrician.  For breastfeeding concerns, the  lactation consultant can be reached at [phone].   Postpartum blues (feelings of happy one minute and sad another minute) are normal for the first few weeks but if it gets worse let your doctor know.   Congratulations! We enjoyed caring for you and your new bundle of joy!

## 2022-06-26 NOTE — Discharge Summary (Signed)
Obstetrical Discharge Summary  Patient Name: Kristine Blake DOB: 03/19/1991 MRN: [id number]  Date of Admission: 06/26/2022 Date of Delivery: 06/26/2022 Delivered by: Kristine Blake, CNM  Date of Discharge: 06/28/2022  Primary OB: [facility] [id number] last menstrual period was 10/01/2021 (exact date). EDC Estimated Date of Delivery: 07/08/22 Gestational Age at Delivery: [redacted]w[redacted]d   Antepartum complications:  Obesity History of PPH History of GDM Herpes  Admitting Diagnosis: Normal labor and delivery [O80]  Secondary Diagnosis: Patient Active Problem List   Diagnosis Date Noted   Normal labor and delivery 06/26/2022   Abnormal glucose tolerance test in pregnancy 04/17/2022   UTI in pregnancy, antepartum 12/26/21 (Group B Strep) 01/30/2022   Supervision of high risk pregnancy, antepartum 12/26/2021   History of gestational diabetes 12/2018 12/26/2021   History of postpartum hemorrhage  (2019) 03/06/2019   Herpes 03/03/2019    Discharge Diagnosis: Term Pregnancy Delivered      Augmentation: N/A Complications: None Intrapartum complications/course: Kristine Blake presented to L&D in active labor. She was expectantly managed progressed quickly to C/C/+2 with an urge to push.  She pushed effectively over approximately 20 minutes for a quick spontaneous vaginal birth. Uterus initially firm with massage with brisk bleeding noted.  Bleeding decreased with fundal massage.  Methergine 0.2mg  IM given x 1 dose.  Uterus firm with small bleeding after oxytocin and methergine.  Delivery Type: spontaneous vaginal delivery Anesthesia: None Placenta: spontaneous To Pathology: No  Laceration: none Episiotomy: none Newborn Data: Live born female "Kristine Blake" Birth Weight:  7lbs 2.6oz APGAR: 8, 9  Newborn Delivery   Birth date/time: 06/26/2022 08:11:00 Delivery type: Vaginal, Spontaneous      Postpartum Procedures: none Edinburgh:     06/27/2022    1:00 PM 05/01/2019    9:35 AM 03/21/2019   12:10  PM  Edinburgh Postnatal Depression Scale Screening Tool  I have been able to laugh and see the funny side of things. 0 0 0  I have looked forward with enjoyment to things. 0 0 0  I have blamed myself unnecessarily when things went wrong. 1 1 1   I have been anxious or worried for no good reason. 1 0 1  I have felt scared or panicky for no good reason. 1 0 1  Things have been getting on top of me. 1 1 1   I have been so unhappy that I have had difficulty sleeping. 1 0 1  I have felt sad or miserable. 1 0 0  I have been so unhappy that I have been crying. 1 1 1   The thought of harming myself has occurred to me. 0 0 0  Edinburgh Postnatal Depression Scale Total 7 3 6      Post partum course:  Patient had an uncomplicated postpartum course.  By time of discharge on PPD#2, her pain was controlled on oral pain medications; she had appropriate lochia and was ambulating, voiding without difficulty and tolerating regular diet.  She was deemed stable for discharge to home.    Discharge Physical Exam:  BP 99/70 (BP Location: Left Arm)   Pulse 84   Temp 97.8 F (36.6 C) (Oral)   Resp 20   Ht 4' 11.06" (1.5 m)   Wt 76.3 kg   LMP 10/01/2021 (Exact Date)   SpO2 100%   BMI 33.91 kg/m   General: NAD CV: RRR Pulm: CTABL, nl effort ABD: s/nd/nt, fundus firm and below the umbilicus Lochia: moderate Perineum: minimal edema/intact DVT Evaluation: LE non-ttp, no evidence of DVT on exam.  Hemoglobin  Date Value Ref Range Status  06/27/2022 10.0 (L) 12.0 - 15.0 g/dL Final  89/21/1941 74.0 11.1 - 15.9 g/dL Final  81/44/8185 63.1  Final   HCT  Date Value Ref Range Status  06/27/2022 30.8 (L) 36.0 - 46.0 % Final  08/14/2018 36 29 - 41 Final   Hematocrit  Date Value Ref Range Status  12/26/2021 38.2 34.0 - 46.6 % Final    Risk assessment for postpartum VTE and prophylactic treatment: Very high risk factors: None High risk factors: None Moderate risk factors: None  Postpartum VTE  prophylaxis with LMWH not indicated  Disposition: stable, discharge to home. Baby Feeding: breast and formula feeding Baby Disposition: home with mom  Rh Immune globulin indicated: No Rubella vaccine given: was not indicated Varivax vaccine given: was not indicated Flu vaccine given in AP setting: declined  Tdap vaccine given in AP setting: Yes   Contraception: oral progesterone-only contraceptive  Prenatal Labs:  Blood type/Rh A pos  Antibody screen neg  Rubella Immune  Varicella Immune  RPR NR  HBsAg Neg  HIV NR  GC neg  Chlamydia neg  Genetic screening declined  1 hour GTT 162  3 hour GTT Equivocal   GBS Pos by urine    Plan:  Kristine Blake was discharged to home in good condition. Follow-up appointment with prenatal provider in 6 weeks.  Discharge Medications: Allergies as of 06/28/2022   No Known Allergies      Medication List     TAKE these medications    acetaminophen 500 MG tablet Commonly known as: TYLENOL Take 2 tablets (1,000 mg total) by mouth every 6 (six) hours.   ibuprofen 600 MG tablet Commonly known as: ADVIL Take 1 tablet (600 mg total) by mouth every 6 (six) hours as needed.   prenatal multivitamin Tabs tablet Take 1 tablet by mouth daily at 12 noon.   valACYclovir 500 MG tablet Commonly known as: VALTREX Take 500 mg by mouth 2 (two) times daily.         Follow-up Information     [facility] DEPT. Schedule an appointment as soon as possible for a visit in 6 week(s).   Why: postpartum visit Contact information: [address] [place] [address] [phone]                Signed:  Margaretmary Blake, CNM Certified Nurse Midwife [place]  [facility] OB[facility]

## 2022-06-26 NOTE — Progress Notes (Signed)
With interpreter pt states she has a weird aching feeling in her thighs and knees. She stated she had this prior to delivery during pregnancy too.   Gave pt 1000mg  Tylenol at 1705. Explained to call and let nurse know if this does not take pain away.

## 2022-06-27 LAB — CBC
HCT: 30.8 % — ABNORMAL LOW (ref 36.0–46.0)
Hemoglobin: 10 g/dL — ABNORMAL LOW (ref 12.0–15.0)
MCH: 27.1 pg (ref 26.0–34.0)
MCHC: 32.5 g/dL (ref 30.0–36.0)
MCV: 83.5 fL (ref 80.0–100.0)
Platelets: 223 10*3/uL (ref 150–400)
RBC: 3.69 MIL/uL — ABNORMAL LOW (ref 3.87–5.11)
RDW: 15.7 % — ABNORMAL HIGH (ref 11.5–15.5)
WBC: 10.7 10*3/uL — ABNORMAL HIGH (ref 4.0–10.5)
nRBC: 0 % (ref 0.0–0.2)

## 2022-06-27 MED ORDER — IBUPROFEN 600 MG PO TABS
600.0000 mg | ORAL_TABLET | Freq: Four times a day (QID) | ORAL | Status: DC
  Administered 2022-06-27 – 2022-06-28 (×5): 600 mg via ORAL
  Filled 2022-06-27 (×5): qty 1

## 2022-06-27 NOTE — Progress Notes (Signed)
Post Partum Day 1 Subjective: Doing well, no complaints.  Tolerating regular diet, pain with PO meds, voiding and ambulating without difficulty.  No CP SOB Fever,Chills, N/V or leg pain; denies nipple or breast pain, no HA change of vision, RUQ/epigastric pain  Objective: BP 110/76 (BP Location: Right Arm)   Pulse 80   Temp 98.4 F (36.9 C) (Oral)   Resp 20   Ht 4' 11.06" (1.5 m)   Wt 76.3 kg   LMP 10/01/2021 (Exact Date)   SpO2 99%   BMI 33.91 kg/m    Physical Exam:  General: NAD Breasts: soft/nontender CV: RRR Pulm: nl effort, CTABL Abdomen: soft, NT, BS x 4 Perineum: minimal edema, intact Lochia: small Uterine Fundus: fundus firm and 1 fb below umbilicus DVT Evaluation: no cords, ttp LEs   Recent Labs    06/26/22 0711 06/27/22 0634  HGB 13.2 10.0*  HCT 39.5 30.8*  WBC 11.6* 10.7*  PLT 300 223    Assessment/Plan: 31 y.o. U2P5361 postpartum day # 1  - Continue routine PP care - Lactation consult prn - Discussed contraceptive options including implant, IUDs hormonal and non-hormonal, injection, pills/ring/patch, condoms, and NFP.  - Acute blood loss anemia - hemodynamically stable and asymptomatic; start po ferrous sulfate BID with stool softeners  - Immunization status: all Imms up to date   Disposition: Does not desire Dc home today.    Francetta Found, CNM 06/27/2022  7:30 PM

## 2022-06-28 ENCOUNTER — Encounter: Payer: Self-pay | Admitting: Obstetrics and Gynecology

## 2022-06-28 ENCOUNTER — Ambulatory Visit: Payer: Self-pay

## 2022-06-28 LAB — RPR: RPR Ser Ql: NONREACTIVE

## 2022-06-28 MED ORDER — IBUPROFEN 600 MG PO TABS: 600.0000 mg | ORAL_TABLET | Freq: Four times a day (QID) | ORAL | 0 refills | Status: DC | PRN

## 2022-06-28 MED ORDER — ACETAMINOPHEN 500 MG PO TABS: 1000.0000 mg | ORAL_TABLET | Freq: Four times a day (QID) | ORAL | 0 refills | Status: DC

## 2022-06-28 NOTE — Progress Notes (Signed)
Patient discharged home with infant. Discharge instructions and prescriptions given and reviewed with patient. Patient verbalized understanding. Escorted out by volunteer.  

## 2022-06-28 NOTE — Plan of Care (Signed)
All education completed with pt and interpretor. Pt left with baby and support person in car home.

## 2022-07-03 ENCOUNTER — Encounter: Payer: Self-pay | Admitting: Obstetrics and Gynecology

## 2022-08-15 ENCOUNTER — Telehealth: Payer: Self-pay

## 2022-08-15 NOTE — Telephone Encounter (Signed)
While making reminder calls to clients with appts tomorrow, this client requested post-partum appt be rescheduled. Roddie Mc Yemen transferred call to clinic and interpreted during call. Post-partum appt rescheduled to 09/06/2022. Client desires ocps for [facility] and counseled to have no sexual intercourse until after appt. Client verbalized understanding. Jossie Ng, RN

## 2022-08-16 ENCOUNTER — Ambulatory Visit: Payer: Self-pay

## 2022-09-06 ENCOUNTER — Ambulatory Visit (LOCAL_COMMUNITY_HEALTH_CENTER): Payer: Self-pay | Admitting: Nurse Practitioner

## 2022-09-06 ENCOUNTER — Encounter: Payer: Self-pay | Admitting: Nurse Practitioner

## 2022-09-06 DIAGNOSIS — Z309 Encounter for contraceptive management, unspecified: Secondary | ICD-10-CM

## 2022-09-06 MED ORDER — NORGESTIM-ETH ESTRAD TRIPHASIC 0.18/0.215/0.25 MG-35 MCG PO TABS: 1.0000 | ORAL_TABLET | Freq: Every day | ORAL | 12 refills | Status: DC

## 2022-09-06 NOTE — Progress Notes (Unsigned)
Postpartum appointment. Seen by FNP White. Family planning packet given and contents reviewed. Birth control consent signed. Birth control dispensed as ordered and instructions provided.

## 2022-09-06 NOTE — Progress Notes (Unsigned)
[facility] Department  Postpartum Exam  Kristine Blake Kristine Blake is a 32 y.o. [phone] female who presents for a postpartum visit. She is 10 weeks postpartum following a normal spontaneous vaginal delivery.  I have fully reviewed the prenatal and intrapartum course. The delivery was at [redacted]w[redacted]d gestational weeks.  Anesthesia: none. Postpartum course has been good. Baby is doing well. Baby is feeding by bottle - Similac . Bleeding no bleeding. Bowel function is normal. Bladder function is normal. Patient is sexually active. Contraception method is OCP (estrogen/progesterone). Postpartum depression screening: negative.   The pregnancy intention screening data noted above was reviewed. Potential methods of contraception were discussed. The patient elected to proceed with No data recorded.    Health Maintenance Due  Topic Date Due   COVID-19 Vaccine (1) Never done   INFLUENZA VACCINE  Never done    The following portions of the patient's history were reviewed and updated as appropriate: allergies, current medications, past family history, past medical history, past social history, past surgical history, and problem list.  Review of Systems A comprehensive review of systems was negative.  Objective:  BP 111/74   Pulse 78   Ht 5' 1.5" (1.562 m)   Wt 155 lb (70.3 kg)   LMP 08/14/2022 (Exact Date)   Breastfeeding No   BMI 28.81 kg/m    General:  alert and cooperative   Breasts:  normal  Lungs: clear to auscultation bilaterally  Heart:  regular rate and rhythm, S1, S2 normal, no murmur, click, rub or gallop  Abdomen: soft, non-tender; bowel sounds normal; no masses,  no organomegaly   Wound none  GU exam:  normal       Assessment:    1. Postpartum exam -32 year old female in clinic today for a postpartum exam. -ROS reviewed, no complaints. -Patient declines STD screening today, blood work, and hemoglobin. -Patient desires to start OCPs. -Patient may have Tri-Sprintec  1 PO daily #13.   Advised to use condoms in the next 7 days as a back up method.  Patient also encouraged to take a PT, if no menstrual or within the next 2 weeks.    - Norgestimate-Ethinyl Estradiol Triphasic (TRI-SPRINTEC) 0.18/0.215/0.25 MG-35 MCG tablet; Take 1 tablet by mouth daily.  Dispense: 28 tablet; Refill: 12    [redacted]w[redacted]d postpartum exam.   Plan:   Essential components of care per ACOG recommendations:  1.  Mood and well being: Patient with negative depression screening today. Reviewed local resources for support.  - Patient tobacco use? No.   - hx of drug use? No.    2. Infant care and feeding:  -Patient currently breastmilk feeding? No.  -Social determinants of health (SDOH) reviewed in EPIC. No concerns.   3. Sexuality, contraception and birth spacing - Patient does not want a pregnancy in the next year.  Desired family size is 5 children.  - Reviewed reproductive life planning. Reviewed options based on patient desire and reproductive life plan. Patient is interested in Oral Contraceptive. This was provided to the patient today.   Risks, benefits, and typical effectiveness rates were reviewed.  Questions were answered.  Written information was also given to the patient to review.    The patient will follow up in  1 years for surveillance.  The patient was told to call with any further questions, or with any concerns about this method of contraception.  Emphasized use of condoms 100% of the time for STI prevention.  ECP not offered due to  last sexual encounter.   - Discussed birth spacing of 18 months  4. Sleep and fatigue -Encouraged family/partner/community support of 4 hrs of uninterrupted sleep to help with mood and fatigue  5. Physical Recovery  - Discussed patients delivery and complications. She describes her labor as good. - Patient had a Vaginal, no problems at delivery. Patient had a  0  laceration. Perineal healing reviewed. Patient expressed understanding -  Patient has urinary incontinence? No. - Patient is safe to resume physical and sexual activity  6.  Health Maintenance - HM due items addressed Yes - Last pap smear 12/26/21. -Breast Cancer screening indicated? No.   7. Chronic Disease/Pregnancy Condition follow up: None  - PCP follow up  Total time spent: 30 minutes  Due to a language barrier an interpreter [phone]) was used for the provider portion of the visit.     Gregary Cromer, FNP

## 2022-09-07 ENCOUNTER — Encounter: Payer: Self-pay | Admitting: Nurse Practitioner

## 2023-08-29 NOTE — L&D Delivery Note (Addendum)
 Delivery Note   Kristine Blake is a 33 y.o. H2E3983 at [redacted]w[redacted]d Estimated Date of Delivery: 03/21/24  PRE-OPERATIVE DIAGNOSIS:  1) [redacted]w[redacted]d pregnancy.  2) A1GDM 3) Hx PPH  POST-OPERATIVE DIAGNOSIS:  1) [redacted]w[redacted]d pregnancy s/p Vaginal, Spontaneous 2) Postpartum hemorrhage, [name] placed  Delivery Type: Vaginal, Spontaneous   Delivery Anesthesia: None  Labor Complications:  None    ESTIMATED BLOOD LOSS: 1300 ml    FINDINGS:   Information for the patient's newborn:  [name], [name] [[id number]]  Live born female  Birth Weight:  pending per protocol APGAR: 8, 9  Newborn Delivery   Birth date/time: 03/24/2024 16:51:00 Delivery type: Vaginal, Spontaneous      SPECIMENS:   PLACENTA:   Appearance: Intact   Removal: Spontaneous     Disposition: discarded  Cord Blood: not collected, mother of baby A POS  DISPOSITION:  Infant left in stable condition in the delivery room, with L&D personnel and mother,  NARRATIVE SUMMARY: Labor course:  Kristine Blake is a H2E3983 at [redacted]w[redacted]d who presented to Labor & Delivery for induction of labor. Her initial cervical exam was 3/50/-1. Labor proceeded with pitocin  augmentation and AROM. She was found to be completely dilated at 1649. With excellent maternal pushing effort, she birthed a viable female infant at 4. Head birthed LOA, restituted to LOA. There was not a nuchal cord. The shoulders were birthed without difficulty. The infant was placed skin-to-skin with mother. The cord was doubly clamped and cut when pulsations ceased.  Large gush of bleeding noted, gentrle traction applied to umbilical cord. The placenta delivered spontaneously with trailing membranes and was noted to be intact with a 3VC. Cytotec  PR placed. Fundus firm with massage, however gush of bleeding again noted. TXA administered. Fundus boggy, firmed with massage. Internal exam performed with ~540ml clot extracted. Methergine  administered. [name]  requested. Dr. Janit called by staff & requested to bedside. Further clot extracted & [name] placed with retention balloon inflated with . Fundus remained firm after placement. Upon Dr. Janit arrival patient stable and [name] remains on suction. A perineal and vaginal examination was performed. Episiotomy/Lacerations: None Tolerated well. Mother and baby left in stable condition.   Harlene LITTIE Cisco, CNM 03/24/2024 6:28 PM

## 2023-11-20 ENCOUNTER — Ambulatory Visit (LOCAL_COMMUNITY_HEALTH_CENTER): Payer: Self-pay

## 2023-11-20 VITALS — BP 123/77 | Ht 61.0 in | Wt 166.5 lb

## 2023-11-20 DIAGNOSIS — Z3009 Encounter for other general counseling and advice on contraception: Secondary | ICD-10-CM

## 2023-11-20 DIAGNOSIS — Z3201 Encounter for pregnancy test, result positive: Secondary | ICD-10-CM

## 2023-11-20 LAB — PREGNANCY, URINE: Preg Test, Ur: POSITIVE — AB

## 2023-11-20 MED ORDER — PRENATAL 27-0.8 MG PO TABS: 1.0000 | ORAL_TABLET | Freq: Every day | ORAL | Status: DC

## 2023-11-20 NOTE — Progress Notes (Signed)
 UPT positive. Plans to receive care at [facility]; sent to clerk for preadmission.   The patient was dispensed prenatal vitamins #100 today. I provided counseling today regarding the medication. We discussed the medication, the side effects and when to call clinic.   Positive pregnancy packet reviewed and given to patient. Also counseled on hydration and when to seek medical attention.   Patient given the opportunity to ask questions. Questions answered.   Interpreter, Myrlene Broker [phone] and Gunnar Fusi [phone], helped during this visit.   Abagail Kitchens, RN

## 2023-11-22 ENCOUNTER — Telehealth: Payer: Self-pay

## 2023-11-22 ENCOUNTER — Encounter: Payer: Self-pay | Admitting: Family Medicine

## 2023-11-22 ENCOUNTER — Ambulatory Visit: Payer: Self-pay | Admitting: Family Medicine

## 2023-11-22 VITALS — BP 116/81 | HR 92 | Temp 97.1°F | Wt 166.6 lb

## 2023-11-22 DIAGNOSIS — F439 Reaction to severe stress, unspecified: Secondary | ICD-10-CM | POA: Insufficient documentation

## 2023-11-22 DIAGNOSIS — E663 Overweight: Secondary | ICD-10-CM

## 2023-11-22 DIAGNOSIS — O0932 Supervision of pregnancy with insufficient antenatal care, second trimester: Secondary | ICD-10-CM

## 2023-11-22 DIAGNOSIS — O0940 Supervision of pregnancy with grand multiparity, unspecified trimester: Secondary | ICD-10-CM | POA: Insufficient documentation

## 2023-11-22 DIAGNOSIS — O0930 Supervision of pregnancy with insufficient antenatal care, unspecified trimester: Secondary | ICD-10-CM | POA: Insufficient documentation

## 2023-11-22 DIAGNOSIS — Z23 Encounter for immunization: Secondary | ICD-10-CM | POA: Diagnosis not present

## 2023-11-22 DIAGNOSIS — O0942 Supervision of pregnancy with grand multiparity, second trimester: Secondary | ICD-10-CM

## 2023-11-22 DIAGNOSIS — Z8632 Personal history of gestational diabetes: Secondary | ICD-10-CM

## 2023-11-22 DIAGNOSIS — B009 Herpesviral infection, unspecified: Secondary | ICD-10-CM | POA: Diagnosis not present

## 2023-11-22 DIAGNOSIS — Z3A22 22 weeks gestation of pregnancy: Secondary | ICD-10-CM | POA: Diagnosis not present

## 2023-11-22 LAB — WET PREP FOR TRICH, YEAST, CLUE
Trichomonas Exam: NEGATIVE
Yeast Exam: NEGATIVE

## 2023-11-22 LAB — HEMOGLOBIN, FINGERSTICK: Hemoglobin: 11.1 g/dL (ref 11.1–15.9)

## 2023-11-22 NOTE — Telephone Encounter (Signed)
 Call to client with Marlene Yemen to notify her of 12/21/23 [facility] MFM [place] appt at 1300. Client to arrive at 1245.Left message to call with number provided. Jossie Ng, RN

## 2023-11-22 NOTE — Progress Notes (Addendum)
 Presents for initiation of prenatal care and denies any care thus far in pregnancy. Counseled on recommendation for flu vaccine for those 6 months of age or older and tolerated injection today without complaint. Desires AFP and MaterniT-21 testing today. 12/20/2022 TST = 0 mm. As denies international travel since test done, Quantiferon TB Gold testing not done today. Rubella / varicella immunity test results from 09/11/2012 in outguide for scanning as is normal hgb fractionation result from that same date. 1 hour glucose tolerance test today due to hx of GDM.Jossie Ng, RN Call to [facility] MFM scheduler x2 and per recorded message, not currently available. Jossie Ng, RN Wet prep negative and hgb = 11.2. No interventions required per standing order. Client counseled regarding CenteringPregnancy (participated with third pregnancy). Per client, unable to participate in Centering due to lack of childcare. Jossie Ng, RN

## 2023-11-22 NOTE — Progress Notes (Signed)
 [facility] [address] [place] [address] Main phone: [phone]  Initial Prenatal Visit  Subjective:  Kristine Blake is a 33 y.o. [id number] at [redacted]w[redacted]d being seen today to start prenatal care at the [facility]. She is currently monitored for the following issues for this high-risk pregnancy:   Patient Active Problem List   Diagnosis Date Noted   Late prenatal care at approx 22 weeks 11/22/2023   Encounter for supervision of high risk pregnancy with grand multiparity, antepartum 11/22/2023   Overweight- pregravid BMI 28.55 11/22/2023   Stress 11/22/2023   History of gestational diabetes 12/2018 12/26/2021   History of postpartum hemorrhage  (2019) 03/06/2019   Herpes 03/03/2019   Patient reports no complaints.  Contractions: Not present. Vag. Bleeding: None.  Movement: Present. Denies leaking of fluid.   Indications for ASA therapy One of the following: Previous pregnancy with preeclampsia, especially early onset and with an adverse outcome No  Multifetal gestation No  Chronic hypertension No  Type 1 or 2 diabetes mellitus No  Chronic kidney disease No  Autoimmune disease (antiphospholipid syndrome, systemic lupus erythematosus) No   Two or more of the following: Nulliparity  No  Obesity (body mass index >30 kg/m2) No  Family history of preeclampsia in mother or sister No  Age >=35 years No  Sociodemographic characteristics (African American race, low socioeconomic level) Yes  Personal risk factors (eg, previous pregnancy with low birth weight or small for gestational age infant, previous adverse pregnancy outcome [eg, stillbirth], interval >10 years between pregnancies) No   The following portions of the patient's history were reviewed and updated as appropriate: allergies, current medications, past family history, past medical history, past social history, past surgical  history and problem list. Problem list updated.  Objective:   Vitals:   11/22/23 0904  BP: 116/81  Pulse: 92  Temp: (!) 97.1 F (36.2 C)  Weight: 166 lb 9.6 oz (75.6 kg)   Fetal Status: Fetal Heart Rate (bpm): 151 Fundal Height: 23 cm Movement: Present      Physical Exam Vitals and nursing note reviewed. Exam conducted with a chaperone present Estill Dooms).  Constitutional:      General: She is not in acute distress.    Appearance: Normal appearance. She is well-developed.  HENT:     Head: Normocephalic and atraumatic.     Right Ear: External ear normal.     Left Ear: External ear normal.     Nose: Nose normal. No congestion or rhinorrhea.     Mouth/Throat:     Lips: Pink.     Mouth: Mucous membranes are moist.     Dentition: Normal dentition. No dental caries.     Pharynx: Oropharynx is clear. Uvula midline.     Comments: Dentition: fair Eyes:     General: No scleral icterus.    Conjunctiva/sclera: Conjunctivae normal.  Neck:     Thyroid: No thyroid mass or thyromegaly.  Cardiovascular:     Rate and Rhythm: Normal rate.     Pulses: Normal pulses.     Comments: Extremities are warm and well perfused Pulmonary:     Effort: Pulmonary effort is normal.     Breath sounds: Normal breath sounds.  Chest:     Chest wall: No mass.  Breasts:    Tanner Score is 5.     Breasts are symmetrical.     Right: Normal. No mass, nipple discharge or skin change.  Left: Normal. No mass, nipple discharge or skin change.  Abdominal:     General: Abdomen is flat.     Palpations: Abdomen is soft.     Tenderness: There is no abdominal tenderness.     Comments: Gravid   Genitourinary:    General: Normal vulva.     Exam position: Lithotomy position.     Pubic Area: No rash.      Labia:        Right: No rash.        Left: No rash.      Vagina: Normal. No vaginal discharge.     Cervix: Eversion present. No cervical motion tenderness or friability.     Uterus: Normal. Enlarged  (Gravid 23 size). Not tender.      Rectum: Normal. No external hemorrhoid.  Musculoskeletal:     Right lower leg: No edema.     Left lower leg: No edema.  Lymphadenopathy:     Cervical: No cervical adenopathy.     Upper Body:     Right upper body: No axillary adenopathy.     Left upper body: No axillary adenopathy.  Skin:    General: Skin is warm.     Capillary Refill: Capillary refill takes less than 2 seconds.  Neurological:     Mental Status: She is alert.     Assessment and Plan:  Pregnancy: [id number] at [redacted]w[redacted]d 33 year old female in clinic today for prenatal care. Patient reports feeling "so-so" about surprise pregnancy. States her partner is more excited than she is   1. [redacted] weeks gestation of pregnancy Late entry to care  2. Encounter for supervision of high risk pregnancy with grand multiparity, antepartum (Primary) -Patient states she is taking PNV daily. -PAP= up to date -Denies alcohol and drug use.  UDS not indicated. -Patient received dental care 2 years ago.  Patient given dental information.   -Agrees to Maternti21 -Patient states she never received COVID vaccines in the past- reviewed recommendation for vaccine. Patient to receive Flu vaccine today. -Hgb= wnl  - Hemoglobin, fingerstick - WET PREP FOR TRICH, YEAST, CLUE - Prenatal Profile I - AFP, Serum, Open Spina Bifida - MaterniT 21 plus Core, Blood - Glucose, 1 hour - [place] MFM OB DETAIL +14 WK; Future  3. Late prenatal care At approx 22 weeks  4. Herpes -plan for suppression therapy at 22 weeks  5. History of gestational diabetes 12/2018 -early GDM testing today  6. Stress -EDPS= 8, will continue to monitor.   - Ambulatory referral to Behavioral Health  7. Overweight (BMI 25.0-29.9) -ASA recommendation reviewed- not indicated per ACOG guidelines -total weight gain of 15-25# discussed today -pt exercises currently- walking 3-4x/week for 20 minutes, counseled to continue   Discussed overview of  care and coordination with inpatient delivery practices including [facility],  [facility].   Unable to participate in Centering due to lack of transport  Preterm labor symptoms and general obstetric precautions including but not limited to vaginal bleeding, contractions, leaking of fluid and fetal movement were reviewed in detail with the patient.  Please refer to After Visit Summary for other counseling recommendations.   Return in about 4 weeks (around 12/20/2023) for Routine Prenatal Care.  No future appointments.  Due to language barrier, a Spanish interpreter Rachel Bo T.) was present in person during the history-taking, subsequent discussion, and physical exam with this patient.   Lenice Llamas, [place]

## 2023-11-22 NOTE — Addendum Note (Signed)
 Addended by: Veatrice Kells on: 11/22/2023 11:46 AM   Modules accepted: Orders

## 2023-11-22 NOTE — Telephone Encounter (Signed)
 Call to client with Marlene Yemen to notify her of [facility] [place] appt on 12/21/23. Client to arrive to appt at 1245. Number to call provided. Jossie Ng, RN

## 2023-11-22 NOTE — Telephone Encounter (Signed)
 Client notified of [place] appt and facility address provided. Dois Davenport, [facility] intake clerk interpreted. Jossie Ng, RN

## 2023-11-23 LAB — HEMOGLOBIN, FINGERSTICK

## 2023-11-25 LAB — PREGNANCY, INITIAL SCREEN
Antibody Screen: NEGATIVE
Basophils Absolute: 0.1 10*3/uL (ref 0.0–0.2)
Basos: 0 %
Bilirubin, UA: NEGATIVE
Chlamydia trachomatis, NAA: NEGATIVE
EOS (ABSOLUTE): 0.1 10*3/uL (ref 0.0–0.4)
Eos: 1 %
Glucose, UA: NEGATIVE
HCV Ab: NONREACTIVE
HIV Screen 4th Generation wRfx: NONREACTIVE
Hematocrit: 34.1 % (ref 34.0–46.6)
Hemoglobin: 11.2 g/dL (ref 11.1–15.9)
Hepatitis B Surface Ag: NEGATIVE
Immature Grans (Abs): 0.2 10*3/uL — ABNORMAL HIGH (ref 0.0–0.1)
Immature Granulocytes: 2 %
Ketones, UA: NEGATIVE
Leukocytes,UA: NEGATIVE
Lymphocytes Absolute: 2.2 10*3/uL (ref 0.7–3.1)
Lymphs: 20 %
MCH: 26.7 pg (ref 26.6–33.0)
MCHC: 32.8 g/dL (ref 31.5–35.7)
MCV: 81 fL (ref 79–97)
Monocytes Absolute: 0.6 10*3/uL (ref 0.1–0.9)
Monocytes: 5 %
Neisseria Gonorrhoeae by PCR: NEGATIVE
Neutrophils Absolute: 8.1 10*3/uL — ABNORMAL HIGH (ref 1.4–7.0)
Neutrophils: 72 %
Nitrite, UA: NEGATIVE
Platelets: 307 10*3/uL (ref 150–450)
Protein,UA: NEGATIVE
RBC, UA: NEGATIVE
RBC: 4.2 x10E6/uL (ref 3.77–5.28)
RDW: 13 % (ref 11.7–15.4)
RPR Ser Ql: NONREACTIVE
Rh Factor: POSITIVE
Rubella Antibodies, IGG: 5.05 {index} (ref >0.99)
Specific Gravity, UA: 1.009 (ref 1.005–1.030)
Urobilinogen, Ur: 0.2 mg/dL (ref 0.2–1.0)
WBC: 11.3 10*3/uL — ABNORMAL HIGH (ref 3.4–10.8)
pH, UA: 7 (ref 5.0–7.5)

## 2023-11-25 LAB — GLUCOSE, 1 HOUR GESTATIONAL: Gestational Diabetes Screen: 183 mg/dL — ABNORMAL HIGH (ref 70–139)

## 2023-11-25 LAB — MICROSCOPIC EXAMINATION
Bacteria, UA: NONE SEEN
Casts: NONE SEEN /LPF
Epithelial Cells (non renal): NONE SEEN /HPF (ref 0–10)
RBC, Urine: NONE SEEN /HPF (ref 0–2)
WBC, UA: NONE SEEN /HPF (ref 0–5)

## 2023-11-25 LAB — URINE CULTURE, OB REFLEX: Organism ID, Bacteria: NO GROWTH

## 2023-11-25 LAB — HCV INTERPRETATION

## 2023-11-26 ENCOUNTER — Telehealth: Payer: Self-pay

## 2023-11-26 NOTE — Telephone Encounter (Signed)
 [employer] Interpreter utilized during call ID # [id number]. Patient notified of elevated 1 HR GTT result; stated she is available Thursday for 3 HR GTT. Instructions given (NPO after 12 mn) and verbalized understanding. Delynn Flavin  RN

## 2023-11-27 ENCOUNTER — Telehealth: Payer: Self-pay | Admitting: Family Medicine

## 2023-11-29 ENCOUNTER — Encounter: Payer: Self-pay | Admitting: Family Medicine

## 2023-11-29 ENCOUNTER — Other Ambulatory Visit: Payer: Self-pay

## 2023-11-29 DIAGNOSIS — O99810 Abnormal glucose complicating pregnancy: Secondary | ICD-10-CM

## 2023-11-29 LAB — MATERNIT 21 PLUS CORE, BLOOD
Fetal Fraction: 14
Result (T21): NEGATIVE
Trisomy 13 (Patau syndrome): NEGATIVE
Trisomy 18 (Edwards syndrome): NEGATIVE
Trisomy 21 (Down syndrome): NEGATIVE

## 2023-11-29 LAB — AFP, SERUM, OPEN SPINA BIFIDA
AFP MoM: 1.13
AFP Value: 84.8 ng/mL
Gest. Age on Collection Date: 22.6 wk
Maternal Age At EDD: 33.5 a
OSBR Risk 1 IN: 8106
Test Results:: NEGATIVE
Weight: 167 [lb_av]

## 2023-11-29 NOTE — Progress Notes (Addendum)
 In nurse clinic for 3 hr GTT. Reports npo since 8 pm last night. Instructions reviewed for test today. Advised to notify RN if nausea/vomiting.   Patient asks about lab results for sex of baby and if any birth defects detected. Lab results consistent with female and do Not indicate birth defects. RN explained this to patient . Questions answered and reports understanding.  RN walked patient to lab for initial lab draw.   Next [facility] appt 12/20/2023 at 8:40am, has reminder.   Alex Gardener, interpreter for majority of visit . Lang line #[id number] for lab result discussion. Jerel Shepherd, RN

## 2023-11-30 ENCOUNTER — Other Ambulatory Visit: Payer: Self-pay | Admitting: Family Medicine

## 2023-11-30 DIAGNOSIS — O99810 Abnormal glucose complicating pregnancy: Secondary | ICD-10-CM

## 2023-11-30 LAB — GESTATIONAL GLUCOSE TOLERANCE
Glucose, Fasting: 73 mg/dL (ref 70–94)
Glucose, GTT - 1 Hour: 165 mg/dL (ref 70–179)
Glucose, GTT - 2 Hour: 163 mg/dL — ABNORMAL HIGH (ref 70–154)
Glucose, GTT - 3 Hour: 136 mg/dL (ref 70–139)

## 2023-12-03 ENCOUNTER — Other Ambulatory Visit: Payer: Self-pay

## 2023-12-20 ENCOUNTER — Ambulatory Visit: Payer: Self-pay | Admitting: Family Medicine

## 2023-12-20 VITALS — BP 109/73 | HR 84 | Temp 97.8°F | Wt 165.6 lb

## 2023-12-20 DIAGNOSIS — O99810 Abnormal glucose complicating pregnancy: Secondary | ICD-10-CM | POA: Diagnosis not present

## 2023-12-20 DIAGNOSIS — O0942 Supervision of pregnancy with grand multiparity, second trimester: Secondary | ICD-10-CM | POA: Diagnosis not present

## 2023-12-20 DIAGNOSIS — Z3A26 26 weeks gestation of pregnancy: Secondary | ICD-10-CM | POA: Diagnosis not present

## 2023-12-20 DIAGNOSIS — O0940 Supervision of pregnancy with grand multiparity, unspecified trimester: Secondary | ICD-10-CM

## 2023-12-20 NOTE — Progress Notes (Signed)
 Patient here for MH RV at 26 6/7. Aware of U/S tomorrow and MNT follow-up in June. S/S PTL reviewed and literature given.Rosiland Cooks, RN

## 2023-12-20 NOTE — Progress Notes (Signed)
  [facility] [address] [place] [address] Main phone: [phone]  Prenatal Visit  Subjective:  Kristine Blake is a 33 y.o. [id number] at [redacted]w[redacted]d being seen today for ongoing prenatal care.  She is currently monitored for the following issues for this high-risk pregnancy:   Patient Active Problem List   Diagnosis Date Noted   Abnormal glucose affecting pregnancy 11/29/2023   Late prenatal care at approx 22 weeks 11/22/2023   Encounter for supervision of high risk pregnancy with grand multiparity, antepartum 11/22/2023   Overweight- pregravid BMI 28.55 11/22/2023   Stress 11/22/2023   History of gestational diabetes 12/2018 12/26/2021   History of postpartum hemorrhage  (2019) 03/06/2019   Herpes 03/03/2019   Patient reports no complaints.  Contractions: Not present. Vag. Bleeding: None.  Movement: Present. Denies leaking of fluid/ROM.   The following portions of the patient's history were reviewed and updated as appropriate: allergies, current medications, past family history, past medical history, past social history, past surgical history and problem list. Problem list updated.  Objective:   Vitals:   12/20/23 0824  BP: 109/73  Pulse: 84  Temp: 97.8 F (36.6 C)  Weight: 165 lb 9.6 oz (75.1 kg)    Fetal Status: Fetal Heart Rate (bpm): 152 Fundal Height: 29 cm Movement: Present     General:  Alert, oriented and cooperative. Patient is in no acute distress.  Skin: Skin is warm and dry. No rash noted.   Cardiovascular: Normal heart rate noted  Respiratory: Normal respiratory effort, no problems with respiration noted  Abdomen: Soft, gravid, appropriate for gestational age.  Pain/Pressure: Absent     Pelvic: Cervical exam deferred        Extremities: Normal range of motion.  Edema: None  Mental Status: Normal mood and affect. Normal behavior. Normal judgment and thought content.   Assessment  and Plan:  Pregnancy: G7P5015 at [redacted]w[redacted]d  1. [redacted] weeks gestation of pregnancy (Primary) -RTC in 2 weeks, do 28 week labs at 30 weeks since late to care  -has anatomy US  tomorrow 12/21/23 -size>dates, will compare with tomorrow's US   2. Encounter for supervision of high risk pregnancy with grand multiparity, antepartum 14 lb 9.6 oz (6.623 kg) -taking PNV daily     Preterm labor symptoms and general obstetric precautions including but not limited to vaginal bleeding, contractions, leaking of fluid and fetal movement were reviewed in detail with the patient. Please refer to After Visit Summary for other counseling recommendations.  No follow-ups on file.  Future Appointments  Date Time Provider Department Center  12/21/2023  1:00 PM [facility] PROVIDER 1 [facility]  12/21/2023  1:30 PM [facility] US5 [facility]  Due to language barrier, a Spanish interpreter [place], [place] [phone] ) was present by phone during the history-taking, subsequent discussion, and physical exam with this patient.      Earleen Glazier, [place]

## 2023-12-21 ENCOUNTER — Other Ambulatory Visit: Payer: Self-pay | Admitting: *Deleted

## 2023-12-21 ENCOUNTER — Ambulatory Visit: Payer: Self-pay | Admitting: Obstetrics

## 2023-12-21 ENCOUNTER — Telehealth: Payer: Self-pay | Admitting: Family Medicine

## 2023-12-21 ENCOUNTER — Ambulatory Visit: Payer: Self-pay | Attending: Family Medicine

## 2023-12-21 VITALS — BP 111/67 | HR 84

## 2023-12-21 DIAGNOSIS — E663 Overweight: Secondary | ICD-10-CM

## 2023-12-21 DIAGNOSIS — O0942 Supervision of pregnancy with grand multiparity, second trimester: Secondary | ICD-10-CM

## 2023-12-21 DIAGNOSIS — Z3A27 27 weeks gestation of pregnancy: Secondary | ICD-10-CM | POA: Insufficient documentation

## 2023-12-21 DIAGNOSIS — O0932 Supervision of pregnancy with insufficient antenatal care, second trimester: Secondary | ICD-10-CM

## 2023-12-21 DIAGNOSIS — O0940 Supervision of pregnancy with grand multiparity, unspecified trimester: Secondary | ICD-10-CM | POA: Insufficient documentation

## 2023-12-21 DIAGNOSIS — O09292 Supervision of pregnancy with other poor reproductive or obstetric history, second trimester: Secondary | ICD-10-CM

## 2023-12-21 DIAGNOSIS — O99810 Abnormal glucose complicating pregnancy: Secondary | ICD-10-CM

## 2023-12-21 NOTE — Progress Notes (Signed)
 MFM Consult Note  Kristine Blake is currently at 27 weeks and 0 days.  She was seen due to grand multiparity.  She reports 6 prior uncomplicated vaginal deliveries.  She presented late for prenatal care as she was in denial of this pregnancy.  She denies any significant past medical history and denies any problems in her current pregnancy.    She had a cell free DNA test earlier in her pregnancy which indicated a low risk for trisomy 20, 66, and 13. A female fetus is predicted.   On today's exam, the overall EFW of 2 pounds 5 ounces measures at the 46 percentile for her gestational age.  There was normal amniotic fluid noted.  There were no obvious fetal anomalies noted on today's ultrasound exam.  However, today's exam was limited due to her advanced gestational age.  The patient was informed that anomalies may be missed due to technical limitations. If the fetus is in a suboptimal position or maternal habitus is increased, visualization of the fetus in the maternal uterus may be impaired.  Due to grand multiparity and as she presented late for prenatal care, a follow-up growth scan was scheduled in 6 weeks.    The patient stated that all of her questions were answered today.    All conversations were held with the patient today with the help of a Spanish interpreter.  A total of 30 minutes was spent counseling and coordinating the care for this patient.  Greater than 50% of the time was spent in direct face-to-face contact.

## 2023-12-21 NOTE — Telephone Encounter (Signed)
 Attempted to call patient today to discuss ultrasound results per patient request (normal US  today, baby growth wnl) No answer- left VM to call back.   Due to language barrier, a Spanish interpreter [place], [place] [phone] ) was present by phone during the history-taking, subsequent discussion, and physical exam with this patient.    [facility] FNP-C

## 2024-01-03 ENCOUNTER — Ambulatory Visit: Payer: Self-pay

## 2024-01-07 ENCOUNTER — Ambulatory Visit: Payer: Self-pay | Admitting: Family Medicine

## 2024-01-07 VITALS — BP 106/71 | HR 89 | Temp 97.0°F | Wt 164.6 lb

## 2024-01-07 DIAGNOSIS — O99810 Abnormal glucose complicating pregnancy: Secondary | ICD-10-CM

## 2024-01-07 DIAGNOSIS — O0943 Supervision of pregnancy with grand multiparity, third trimester: Secondary | ICD-10-CM

## 2024-01-07 DIAGNOSIS — B009 Herpesviral infection, unspecified: Secondary | ICD-10-CM

## 2024-01-07 DIAGNOSIS — O0940 Supervision of pregnancy with grand multiparity, unspecified trimester: Secondary | ICD-10-CM

## 2024-01-07 DIAGNOSIS — Z23 Encounter for immunization: Secondary | ICD-10-CM

## 2024-01-07 DIAGNOSIS — Z3A29 29 weeks gestation of pregnancy: Secondary | ICD-10-CM

## 2024-01-07 LAB — HEMOGLOBIN, FINGERSTICK: Hemoglobin: 12.2 g/dL (ref 11.1–15.9)

## 2024-01-07 NOTE — Assessment & Plan Note (Addendum)
 Uninsured currently. Will check on what we have in stock, if any antivirals. Will need to stat suppression therapy at 36 weeks. Patient amenable, all questions answered.

## 2024-01-07 NOTE — Patient Instructions (Signed)
 I translated the following text using Google translate.  Please excuse any errors.  He traducido el siguiente texto con el traductor de Microbiologist. Disculpe cualquier error.  Pregnancy Continue taking your prenatal vitamin daily.  Please schedule your next prenatal visit for about 2 weeks from now.  Please visit WIC (women's, infants, and children program) to see about their breast feeding peer support program. You can start this program to get tips and tricks for successful breast feeding of your baby.  Embarazo Contine tomando sus vitamina prenatal diaria. Programe su prxima visita prenatal para dentro de aproximadamente 2 [address]. Visita WIC (programa para mujeres, bebs y nios) para Solicitor su programa de apoyo entre pares para la Tour manager. Puedes iniciar este programa para obtener consejos y trucos para una lactancia exitosa de tu beb.  Prenatal Classes If delivering at [facility] with [facility] or [facility]: Go to [email or web address]   If delivering at [facility]: Go to [email or web address] and search for "Pregnancy and Parenting Classes" "Prepared Childbirth Classes" Clases prenatales Si el parto se realizar en [facility] [place] con el OB de la [facility] o en [facility]: Visite [email or web address]  Si el parto se realizar en la [facility]: visite [email or web address] y busque: "Clases de embarazo y crianza" "Clases de preparacin para el parto"  Resource regarding exposures that could affect pregnancy: Mother To Pecola Leisure is a Dentist with lots of information on the effects of many medications and exposures on your pregnancy. It is free to use, including their web site, phone line, text service, app, or email and live chat.  [email or web address]/ Email or live chat: MotherToBaby.org Phone: [phone] Text:  [phone] App: search "LactRx"  Recursos sobre exposiciones que podran afectar el embarazo: Mother To Baby es una organizacin sin fines de Visual merchandiser con mucha informacin [employer] Stationers efectos de muchos medicamentos y exposiciones en Firefighter. Su uso es gratuito, incluido su sitio web, Art therapist, servicio de mensajes de texto, aplicacin o correo electrnico y chat en vivo. CDApps.pl Correo electrnico o chat en vivo: MotherToBaby.org Telfono: [phone] Mensaje de texto: [phone] Aplicacin: busque "LactRx"  Maternal Mental Health If you start to develop the below symptoms of depression, please reach out to [place] for an appointment. There is also a Biomedical scientist Health Hotline at [phone] 4171247592). This hotline has trained counselors, doulas, and midwifes to real-time support, information, and resources.  Feeling sad or hopeless most of the time Lack of interest in things you used to enjoy Less interest in caring for yourself (dressing, fixing hair) Trouble concentrating Trouble coping with daily tasks Constant worry about your baby Sleeping or eating too much or too little Feeling very anxious or nervous Unexplained irritability or anger Unwanted or scary thoughts Feeling that you are not a good mother Thoughts of hurting yourself or your baby If you feel you are experiencing a mental health crisis, please reach out to the National Suicide Prevention Hotline at 1-800-273-TALK 7852097419).  Salud Mental Materna Si comienza a Environmental education officer los siguientes sntomas de depresin, comunquese con nosotros para programar una cita. Tambin hay una lnea directa nacional de salud mental materna en el 1-833-9-HELP4MOMS [phone]). Esta lnea directa ha capacitado a consejeros, doulas y parteras para brindar apoyo, informacin y recursos en tiempo real.  [place] Sizer triste o sin esperanza la mayor parte del tiempo   Falta de inters en las cosas que sola disfrutar  Menos inters en cuidarse a s mismo (vestirse, arreglarse el cabello)  Problemas para concentrarse  Problemas para hacer frente a las tareas diarias  Preocupacin constante por su beb  Dormir o Microbiologist o muy poco  Sentirse muy ansioso o nervioso  Irritabilidad o ira inexplicables  Pensamientos no deseados o Insurance underwriter que no eres una buena madre  Pensamientos de Runner, broadcasting/film/video a s Visual merchandiser o a su beb Si cree que est experimentando una crisis de salud mental, comunquese con la [employer] de Prevencin del Suicidio al 1-800-273-TALK [phone]).

## 2024-01-07 NOTE — Progress Notes (Signed)
 [facility] [address], [address] [place] [address] Main phone: [phone]  Prenatal Visit  Subjective:  Kristine Blake is a 33 y.o. B1Y7829 at [redacted]w[redacted]d being seen today for ongoing prenatal care.  She is currently monitored for the following issues for this low-risk pregnancy:   Patient Active Problem List   Diagnosis Date Noted   Abnormal glucose affecting pregnancy 11/29/2023   Late prenatal care at approx 22 weeks 11/22/2023   Encounter for supervision of high risk pregnancy with grand multiparity, antepartum 11/22/2023   Overweight- pregravid BMI 28.55 11/22/2023   Stress 11/22/2023   History of gestational diabetes 12/2018 12/26/2021   History of postpartum hemorrhage  (2019) 03/06/2019   Herpes 03/03/2019   Patient reports no complaints.  Contractions: Not present. Vag. Bleeding: None.  Movement: Present. Denies leaking of fluid/ROM.   The following portions of the patient's history were reviewed and updated as appropriate: allergies, current medications, past family history, past medical history, past social history, past surgical history and problem list. Problem list updated.  Objective:   Vitals:   01/07/24 1320  BP: 106/71  Pulse: 89  Temp: (!) 97 F (36.1 C)  Weight: 164 lb 9.6 oz (74.7 kg)   Fetal Status: Fetal Heart Rate (bpm): 156 Fundal Height: 31 cm Movement: Present     General:  Alert, oriented and cooperative. Patient is in no acute distress.  Skin: Skin is warm and dry. No rash noted.   Cardiovascular: Normal heart rate noted  Respiratory: Normal respiratory effort, no problems with respiration noted  Abdomen: Soft, gravid, appropriate for gestational age.  Pain/Pressure: Absent     Pelvic: Cervical exam deferred        Extremities: Normal range of motion.     Mental Status: Normal mood and affect. Normal behavior. Normal judgment and thought content.   Assessment and Plan:   Pregnancy: [id number] at [redacted]w[redacted]d  [redacted] weeks gestation of pregnancy  Encounter for supervision of high risk pregnancy with grand multiparity, antepartum Assessment & Plan: Patient ate this morning not understanding about her 3 hr GTT. She would like to do the one hour GTT at her next appointment in about two weeks.   Discussed normal MFM US  and 6 week follow up. She is thinking of cancelling the f/u US  - we discussed this using shared decision making in context of normal/negative MarterniT21 and AFP screenings and normal MFM US  (within the restrictions of body habitus and GA).   Doing well. BP normal at 106/71. TWG 13 lb 9.6 oz (6.169 kg). Taking prenatal vitamin daily. Next prenatal appointment in 2 weeks.   Discussed or disclosed in AVS: - Breast feeding support through [facility] breast feeding peer counselor program - Mood resources, including 988, Maternal Mental Health Line, and counselor Nyle Belling, LCSW, here at [facility] - Prenatal classes - Pain control during delivery: un-medicated delivery - Support person during delivery: husband - Pre-eclampsia warning signs: Signs and symptoms of preeclampsia were verbally reviewed with warning signs, when and how to call. A written handout with tips on how to take your blood pressure, as well as warning signs was provided to the patient.   Orders: -     HIV-1/HIV-2 Qualitative RNA -     RPR -     Hemoglobin, fingerstick -     Tdap vaccine greater than or equal to 7yo IM  Abnormal glucose affecting pregnancy  Herpes Assessment & Plan: Uninsured currently. Will check on  what we have in stock, if any antivirals. Will need to stat suppression therapy at 36 weeks. Patient amenable, all questions answered.     Preterm labor symptoms and general obstetric precautions including but not limited to vaginal bleeding, contractions, leaking of fluid and fetal movement were reviewed in detail with the patient. Please refer to After Visit Summary for other  counseling recommendations.  No follow-ups on file.  Future Appointments  Date Time Provider Department Center  02/01/2024  9:00 AM [facility] PROVIDER 1 [facility]  02/01/2024  9:30 AM [facility] US5 [facility]   Jack Marts, MD

## 2024-01-07 NOTE — Assessment & Plan Note (Addendum)
 Patient ate this morning not understanding about her 3 hr GTT. She would like to do the one hour GTT at her next appointment in about two weeks.   Discussed normal MFM US  and 6 week follow up. She is thinking of cancelling the f/u US  - we discussed this using shared decision making in context of normal/negative MarterniT21 and AFP screenings and normal MFM US  (within the restrictions of body habitus and GA).   Doing well. BP normal at 106/71. TWG 13 lb 9.6 oz (6.169 kg). Taking prenatal vitamin daily. Next prenatal appointment in 2 weeks.   Discussed or disclosed in AVS: - Breast feeding support through [facility] breast feeding peer counselor program - Mood resources, including 988, Maternal Mental Health Line, and counselor Nyle Belling, LCSW, here at [facility] - Prenatal classes - Pain control during delivery: un-medicated delivery - Support person during delivery: husband - Pre-eclampsia warning signs: Signs and symptoms of preeclampsia were verbally reviewed with warning signs, when and how to call. A written handout with tips on how to take your blood pressure, as well as warning signs was provided to the patient.

## 2024-01-07 NOTE — Progress Notes (Addendum)
 Patient last ate at 11 a.m. today therefore unable to draw 3 HR GTT. Labs drawn and hgb result reviewed during visit. After discussion with provider decision made for 1 HR GTT at next RV. [facility] Forms completed. Gery Kubas RN

## 2024-01-09 ENCOUNTER — Ambulatory Visit: Payer: Self-pay

## 2024-01-09 LAB — HIV-1/HIV-2 QUALITATIVE RNA
HIV-1 RNA, Qualitative: NONREACTIVE
HIV-2 RNA, Qualitative: NONREACTIVE

## 2024-01-09 LAB — RPR: RPR Ser Ql: NONREACTIVE

## 2024-01-22 ENCOUNTER — Ambulatory Visit: Payer: Self-pay | Admitting: Family Medicine

## 2024-01-22 ENCOUNTER — Encounter: Payer: Self-pay | Admitting: Family Medicine

## 2024-01-22 VITALS — BP 100/66 | HR 84 | Temp 97.2°F | Wt 165.8 lb

## 2024-01-22 DIAGNOSIS — O99810 Abnormal glucose complicating pregnancy: Secondary | ICD-10-CM

## 2024-01-22 DIAGNOSIS — O0940 Supervision of pregnancy with grand multiparity, unspecified trimester: Secondary | ICD-10-CM

## 2024-01-22 DIAGNOSIS — Z3A31 31 weeks gestation of pregnancy: Secondary | ICD-10-CM

## 2024-01-22 DIAGNOSIS — O0943 Supervision of pregnancy with grand multiparity, third trimester: Secondary | ICD-10-CM

## 2024-01-22 NOTE — Progress Notes (Signed)
 [facility] [address], [address] [place] [address] Main phone: [phone]  Prenatal Visit  Subjective:  Kristine Blake is a 33 y.o. [id number] at [redacted]w[redacted]d being seen today for ongoing prenatal care.  She is currently monitored for the following issues for this low-risk pregnancy:   Patient Active Problem List   Diagnosis Date Noted   Abnormal glucose affecting pregnancy 11/29/2023   Late prenatal care at approx 22 weeks 11/22/2023   Encounter for supervision of high risk pregnancy with grand multiparity, antepartum 11/22/2023   Overweight- pregravid BMI 28.55 11/22/2023   Stress 11/22/2023   History of gestational diabetes 12/2018 12/26/2021   History of postpartum hemorrhage  (2019) 03/06/2019   Herpes 03/03/2019   Patient reports constipation.  Contractions: Not present. Vag. Bleeding: None.  Movement: Present. Denies leaking of fluid/ROM.   The following portions of the patient's history were reviewed and updated as appropriate: allergies, current medications, past family history, past medical history, past social history, past surgical history and problem list. Problem list updated.  Objective:   Vitals:   01/22/24 0829  BP: 100/66  Pulse: 84  Temp: (!) 97.2 F (36.2 C)  Weight: 165 lb 12.8 oz (75.2 kg)    Fetal Status: Fetal Heart Rate (bpm): 140 Fundal Height: 33 cm Movement: Present     General:  Alert, oriented and cooperative. Patient is in no acute distress.  Skin: Skin is warm and dry. No rash noted.   Cardiovascular: Normal heart rate noted  Respiratory: Normal respiratory effort, no problems with respiration noted  Abdomen: Soft, gravid, appropriate for gestational age.  Pain/Pressure: Present     Pelvic: Cervical exam deferred        Extremities: Normal range of motion.  Edema: None  Mental Status: Normal mood and affect. Normal behavior. Normal judgment and thought content.    Assessment and Plan:  Pregnancy: G7P5015 at [redacted]w[redacted]d  1. [redacted] weeks gestation of pregnancy   2. Encounter for supervision of high risk pregnancy with grand multiparity, antepartum (Primary) 14 lb 12.8 oz (6.713 kg) -taking PNV daily  -reports constipation- encouraged increased water, fiber from vegetables and use of OTCs with Miralax and colace as needed  3. Abnormal glucose affecting pregnancy -at 23 weeks abnormal 1 hr and wnl 3 hr -today was scheduled for a 1 hr again- reports it is hard for her to get child care to do a 3 hr -I counseled with her that it will be very likely that the 1 hr will be high- and asked if she could do a 2 hr. Desired to come back another day for a 3 hr -reports she is checking her blood sugar at home because she knows she has a hx of GDM and has the supplies -reviewed that it is beneficial to do the test as soon as possible to diagnose GDM sooner -agrees to 2 hr tomorrow morning     Preterm labor symptoms and general obstetric precautions including but not limited to vaginal bleeding, contractions, leaking of fluid and fetal movement were reviewed in detail with the patient. Please refer to After Visit Summary for other counseling recommendations.  Return in about 2 weeks (around 02/05/2024) for Routine Prenatal Care.  Future Appointments  Date Time Provider Department Center  01/23/2024  8:20 AM [facility] PROVIDER [facility] None  02/01/2024  9:00 AM [facility] PROVIDER 1 [facility]  02/01/2024  9:30 AM [facility] US5 [facility]  Due to language barrier, a  Spanish interpreter [place].) was present in person during the history-taking, subsequent discussion, and physical exam with this patient.    Earleen Glazier, [place]

## 2024-01-22 NOTE — Progress Notes (Addendum)
 Here today for 31.4 week MH RV and repeat 3hgtt. Taking PNV and Prilosec every day. Denies ED/hospital visits since last RV. Patient states she was told she could have a 1hgtt today. RN will clarify with provider. Kick Count cards given with instructions. Eden Goodpasture, RN

## 2024-01-23 ENCOUNTER — Ambulatory Visit: Payer: Self-pay | Admitting: Family Medicine

## 2024-01-23 DIAGNOSIS — O99810 Abnormal glucose complicating pregnancy: Secondary | ICD-10-CM

## 2024-01-23 NOTE — Progress Notes (Signed)
 Here for 2 hour gtt. States she has not eaten since before midnight last night.Rosiland Cooks, RN

## 2024-01-24 ENCOUNTER — Telehealth: Payer: Self-pay | Admitting: Family Medicine

## 2024-01-24 ENCOUNTER — Encounter: Payer: Self-pay | Admitting: Family Medicine

## 2024-01-24 ENCOUNTER — Ambulatory Visit: Payer: Self-pay

## 2024-01-24 DIAGNOSIS — O24419 Gestational diabetes mellitus in pregnancy, unspecified control: Secondary | ICD-10-CM | POA: Insufficient documentation

## 2024-01-24 LAB — GLUCOSE TOLERANCE, 2 HOURS
Glucose, 2 hour: 156 mg/dL — ABNORMAL HIGH (ref 70–139)
Glucose, GTT - Fasting: 70 mg/dL (ref 70–99)

## 2024-01-24 MED ORDER — LANCET DEVICE MISC: 1.0000 | Freq: Three times a day (TID) | Status: AC

## 2024-01-24 MED ORDER — BLOOD GLUCOSE TEST VI STRP: 1.0000 | ORAL_STRIP | Freq: Three times a day (TID) | Status: AC

## 2024-01-24 MED ORDER — BLOOD GLUCOSE MONITORING SUPPL DEVI: 1.0000 | Freq: Three times a day (TID) | Status: DC

## 2024-01-24 MED ORDER — LANCETS MISC. MISC: 1.0000 | Freq: Three times a day (TID) | Status: AC

## 2024-01-24 NOTE — Telephone Encounter (Signed)
  1. Gestational diabetes mellitus (GDM) in third trimester, gestational diabetes method of control unspecified (Primary) Called patient to review diagnosis of GDM. Patient already has diabetic supplies at home and had been checking her blood sugar at home. Reviewed recommendation of taking BS 4x/day. Has hx of GDM. Reviewed that I will order diabetic supplies and she can pick them up tomorrow. Patient states that she will need to come Monday to pick them up. Counseled that weekly appointments are recommended with GDM, and tomorrow a nurse will call to get her scheduled for next week.    - Blood Glucose Monitoring Suppl DEVI; 1 each by Does not apply route in the morning, at noon, and at bedtime. May substitute to any manufacturer covered by patient's insurance. - Glucose Blood (BLOOD GLUCOSE TEST STRIPS) STRP; 1 each by In Vitro route in the morning, at noon, and at bedtime. May substitute to any manufacturer covered by patient's insurance. - Lancet Device MISC; 1 each by Does not apply route in the morning, at noon, and at bedtime. May substitute to any manufacturer covered by patient's insurance. - Lancets Misc. MISC; 1 each by Does not apply route in the morning, at noon, and at bedtime. May substitute to any manufacturer covered by patient's insurance. - Amb ref to Medical Nutrition Therapy-MNT  Due to language barrier, a Spanish interpreter Garnet Just, [place] [phone]) was present by phone during the history-taking, subsequent discussion, and physical exam with this patient.    [facility] FNP-C

## 2024-01-25 NOTE — Telephone Encounter (Signed)
 Called patient to schedule appointment week of 01/28/2024. Left message and call back number provided. [employer] utilized. ID # [id number]. Gery Kubas RN

## 2024-01-28 NOTE — Telephone Encounter (Signed)
 TC to patient to schedule her for appointment this week. Patient states she needs to come at 8:00 am. Patient booked for 01/30/24. Patient aware [facility] appointment on 01/31/24 and appointments on 02/01/24. Interpreter services, ID # [id number].Aaron AasRosiland Cooks, RN

## 2024-01-29 ENCOUNTER — Ambulatory Visit: Payer: Self-pay

## 2024-01-30 ENCOUNTER — Ambulatory Visit: Payer: Self-pay

## 2024-01-30 ENCOUNTER — Encounter: Payer: Self-pay | Admitting: Family Medicine

## 2024-01-30 ENCOUNTER — Ambulatory Visit: Payer: Self-pay | Admitting: Family Medicine

## 2024-01-30 VITALS — BP 98/65 | HR 81 | Temp 97.0°F | Wt 164.4 lb

## 2024-01-30 DIAGNOSIS — O0940 Supervision of pregnancy with grand multiparity, unspecified trimester: Secondary | ICD-10-CM

## 2024-01-30 DIAGNOSIS — O0943 Supervision of pregnancy with grand multiparity, third trimester: Secondary | ICD-10-CM

## 2024-01-30 DIAGNOSIS — Z3A32 32 weeks gestation of pregnancy: Secondary | ICD-10-CM

## 2024-01-30 DIAGNOSIS — O24419 Gestational diabetes mellitus in pregnancy, unspecified control: Secondary | ICD-10-CM

## 2024-01-30 LAB — URINALYSIS
Bilirubin, UA: NEGATIVE
Glucose, UA: NEGATIVE
Ketones, UA: NEGATIVE
Nitrite, UA: NEGATIVE
Protein,UA: NEGATIVE
RBC, UA: NEGATIVE
Specific Gravity, UA: 1.025 (ref 1.005–1.030)
Urobilinogen, Ur: 0.2 mg/dL (ref 0.2–1.0)
pH, UA: 6 (ref 5.0–7.5)

## 2024-01-30 NOTE — Progress Notes (Addendum)
 Per client, has been taking blood sugars at home, but did not bring log with her to clinic. Aware of [facility] diabetes class tomorrow at 0800. Counseled to bring blood sugar log to all appts and understanding verbalized. Urine dip today. Given appt reminder card for MFM consult appt in [facility] 02/01/24. Currently does not plan to keep appt and counseled to discuss with [facility] provider this am. Ariel Begun, RN Urine dip with +1 leukocytes, otherwise WNL and reviewed by [facility] FNP-C. Ariel Begun, RN 1 week [facility] RV appt scheduled and appt reminder card given. Ariel Begun, RN

## 2024-01-30 NOTE — Progress Notes (Signed)
 [facility] [address] [place] [address] Main phone: [phone]  Prenatal Visit  Subjective:  Kristine Blake is a 33 y.o. [id number] at [redacted]w[redacted]d being seen today for ongoing prenatal care.  She is currently monitored for the following issues for this high-risk pregnancy:   Patient Active Problem List   Diagnosis Date Noted   Gestational diabetes mellitus (GDM) in third trimester 01/24/2024   Late prenatal care at approx 22 weeks 11/22/2023   Encounter for supervision of high risk pregnancy with grand multiparity, antepartum 11/22/2023   Overweight- pregravid BMI 28.55 11/22/2023   Stress 11/22/2023   History of postpartum hemorrhage  (2019) 03/06/2019   Herpes 03/03/2019   Patient reports no complaints.  Contractions: Irregular. Vag. Bleeding: None.  Movement: Present. Denies leaking of fluid/ROM.   The following portions of the patient's history were reviewed and updated as appropriate: allergies, current medications, past family history, past medical history, past social history, past surgical history and problem list. Problem list updated.  Objective:   Vitals:   01/30/24 0835  BP: 98/65  Pulse: 81  Temp: (!) 97 F (36.1 C)  Weight: 164 lb 6.4 oz (74.6 kg)    Fetal Status: Fetal Heart Rate (bpm): 136 Fundal Height: 32 cm Movement: Present     General:  Alert, oriented and cooperative. Patient is in no acute distress.  Skin: Skin is warm and dry. No rash noted.   Cardiovascular: Normal heart rate noted  Respiratory: Normal respiratory effort, no problems with respiration noted  Abdomen: Soft, gravid, appropriate for gestational age.  Pain/Pressure: Present     Pelvic: Cervical exam deferred        Extremities: Normal range of motion.  Edema: None  Mental Status: Normal mood and affect. Normal behavior. Normal judgment and thought content.   Assessment and Plan:  Pregnancy: G7P5015 at  [redacted]w[redacted]d  1. [redacted] weeks gestation of pregnancy (Primary) RTC in 1 week  2. Gestational diabetes mellitus (GDM) in third trimester, gestational diabetes method of control unspecified Encouraged continued daily exercise and diet modifications. -did not bring BS log today -reports some values she has seen: states that in the morning her BS is usually 80, 2 hours pp reports 100-118 Exercise: walks 20. Minutes 3-4 days a week  Diet recall :   Breakfast = nothing this AM                      Lunch = chicken tortilla with salad                       Dinner = red meat with vegetables, toast , water                       Snack = green apple, granola  Water: drinks about  3-4 bottles of water/day and night   -has MFM follow up with growth US  and consult on 6/6- was not planning to go reports she thought it was optional- reviewed that we like to check on babies growth but also get recommendations from MFM regarding possible medications or testing if needed, strongly encouraged to keep appt  -[facility] appt tomorrow at 0800 13 lb 6.4 oz (6.078 kg) UA reviewed=negative  - Urinalysis (Urine Dip)  3. Encounter for supervision of high risk pregnancy with grand multiparity, antepartum -taking PNV daily    Preterm labor symptoms and general obstetric precautions  including but not limited to vaginal bleeding, contractions, leaking of fluid and fetal movement were reviewed in detail with the patient. Please refer to After Visit Summary for other counseling recommendations.  Return in about 1 week (around 02/06/2024) for Routine Prenatal Care.  Future Appointments  Date Time Provider Department Center  01/31/2024  8:00 AM Lovina Ruddle, RD [facility] None  02/01/2024  9:00 AM [facility] PROVIDER 1 [facility]  02/01/2024  9:30 AM [facility] US5 [facility]  02/06/2024  9:00 AM [facility] PROVIDER [facility] None  Due to language barrier, a Spanish interpreter [place].) was present in person during the history-taking,  subsequent discussion, and physical exam with this patient.    Earleen Glazier, [place]

## 2024-01-31 ENCOUNTER — Encounter: Payer: Self-pay | Admitting: Dietician

## 2024-01-31 ENCOUNTER — Encounter: Payer: Self-pay | Attending: Family Medicine | Admitting: Dietician

## 2024-01-31 DIAGNOSIS — O24419 Gestational diabetes mellitus in pregnancy, unspecified control: Secondary | ICD-10-CM

## 2024-01-31 DIAGNOSIS — Z3A32 32 weeks gestation of pregnancy: Secondary | ICD-10-CM | POA: Insufficient documentation

## 2024-01-31 DIAGNOSIS — O24410 Gestational diabetes mellitus in pregnancy, diet controlled: Secondary | ICD-10-CM | POA: Insufficient documentation

## 2024-01-31 DIAGNOSIS — Z713 Dietary counseling and surveillance: Secondary | ICD-10-CM | POA: Insufficient documentation

## 2024-01-31 NOTE — Progress Notes (Signed)
 Patient was seen for Gestational Diabetes on 01/31/2024  Start time 0810 and End time 0900   Estimated due date: 03/21/2024; [redacted]w[redacted]d  Interpreter from [facility], Name:  Mason Sole, #[facility] [id number]  Clinical: Medications: Prenatal MVI, Omeprazole Medical History: GDM, Stress Labs: OGTT fasting 70 mg/dL, 2 hour 119 mg/dL on 1/47/8295   Dietary and Lifestyle History: Pt reports previous GDM in last 2 pregnancies, states they were able to keep it well controlled. Pt reports seeing a nutritionist about 2 months ago after initial OGTT  was slightly elevated, started following low carb diet and states numbers have been in range, went for a follow up OGTT and 2 hr was slightly elevated so they were referred to dietitian again. Pt reports eating more vegetables and fruit (apples, strawberries, pears), eating minimal tortillas and rice. Pt reports raising 5 kids (2,5,7,9,11), states this can be overwhelming/stressful, states they will take kids outside to play to destress.   Physical Activity: ADLs, Walks 3-4 times a week for 20-30 minutes Stress: 7/10  Sleep: Poorly r/t occasional reflux, going to the bathroom  24 hr Recall:  First Meal:  Coffee w/ milk & stevia, 2 fried eggs, spinach Snack: Apple Second meal:  2 chicken tacos on corn tortillas, lettuce, tomato, avocado, water Snack:  yogurt Third meal:  Vegetable soup w/ beef, water Snack: none Beverages:  Water, cafe  NUTRITION INTERVENTION  Nutrition education (E-1) on the following topics:   Initial Follow-up  [x]  []  Definition of Gestational Diabetes [x]  []  Why dietary management is important in controlling blood glucose [x]  []  Effects each nutrient has on blood glucose levels [x]  []  Simple carbohydrates vs complex carbohydrates [x]  []  Fluid intake [x]  []  Creating a balanced meal plan [x]  []  Carbohydrate counting  [x]  []  When to check blood glucose levels [x]  []  Proper blood glucose monitoring techniques [x]  []  Effect of stress and  stress reduction techniques  [x]  []  Exercise effect on blood glucose levels, appropriate exercise during pregnancy [x]  []  Importance of limiting caffeine and abstaining from alcohol and smoking [x]  []  Medications used for blood sugar control during pregnancy [x]  []  Hypoglycemia and rule of 15 [x]  []  Postpartum self care  Patient has a meter prior to visit. Patient is testing pre breakfast and 2 hours after each meal. FBS: ~80-90 mg/dL Postprandial: ~621-308  Patient instructed to monitor glucose levels: FBS: 60 - <= 95 mg/dL; 2 hour: <= 657 mg/dL  Patient received handouts: Nutrition Diabetes and Pregnancy Spanish Blood glucose log Spanish Snack ideas for diabetes during pregnancy Spanish  Patient will be seen for follow-up as needed.

## 2024-01-31 NOTE — Patient Instructions (Addendum)
 Si sufre de reflujo durante la noche, evite comer yogur por la noche y duerma.  Al medir su nivel de azcar en sangre, pinche el lateral del dedo en lugar de la punta. Configure el dispositivo de puncin a un nmero ms bajo al comenzar a medir el lateral. Si no obtiene una buena gota de [place], [place] gradualmente [place] obtener una [place]. [place] los dedos cada vez que se mida!

## 2024-01-31 NOTE — Addendum Note (Signed)
 Addended by: Sariya Trickey on: 01/31/2024 12:55 PM   Modules accepted: Orders

## 2024-02-01 ENCOUNTER — Ambulatory Visit: Payer: Self-pay | Attending: Obstetrics

## 2024-02-01 ENCOUNTER — Ambulatory Visit: Payer: Self-pay | Admitting: Obstetrics

## 2024-02-01 DIAGNOSIS — Z3A33 33 weeks gestation of pregnancy: Secondary | ICD-10-CM

## 2024-02-01 DIAGNOSIS — O0943 Supervision of pregnancy with grand multiparity, third trimester: Secondary | ICD-10-CM

## 2024-02-01 DIAGNOSIS — O0940 Supervision of pregnancy with grand multiparity, unspecified trimester: Secondary | ICD-10-CM

## 2024-02-01 DIAGNOSIS — O24419 Gestational diabetes mellitus in pregnancy, unspecified control: Secondary | ICD-10-CM | POA: Insufficient documentation

## 2024-02-01 DIAGNOSIS — O24410 Gestational diabetes mellitus in pregnancy, diet controlled: Secondary | ICD-10-CM

## 2024-02-01 DIAGNOSIS — O24813 Other pre-existing diabetes mellitus in pregnancy, third trimester: Secondary | ICD-10-CM

## 2024-02-01 NOTE — Progress Notes (Signed)
 MFM Consult Note  Kristine Blake is currently at 33 weeks and 0 days.  She has been followed due to grand multiparity and as she presented late for prenatal care.    She was recently diagnosed with diet-controlled gestational diabetes. The patient reports that 2 of her prior pregnancies were also complicated by gestational diabetes.    I reviewed her fingerstick log and her fingerstick values have all been within normal limits.  On today's exam, the overall EFW of 5 pounds measures at the 66thpercentile for her gestational age.    There was normal amniotic fluid noted with a total AFI of 17.29 cm.  The fetus was in the vertex presentation.  The following were discussed during our consultation today:  Gestational diabetes   She was advised to continue to monitor her fingersticks 4 times daily (fasting and 2 hours after each meal).    She was advised that our goals for her fingerstick values are fasting values of 90-95 or less and two-hour postprandial values of 120 or less.    Should the majority of her fingerstick results (greater than 50%) be above these values, she may have to be started on insulin or metformin to help her achieve better glycemic control.   Weekly fetal testing is recommended should she require insulin or metformin for treatment.  The patient was advised that delivery for well-controlled diabetes in pregnancy is usually recommended at around 39 weeks.    The patient was offered and declined another ultrasound to assess the fetal growth closer to delivery.  She was reassured that based on the EFW obtained today, this baby's weight at birth will most likely be similar to her other children.    The patient stated that all of her questions were answered today.    All conversations were held with the patient today with the help of a Spanish interpreter.  A total of 20 minutes was spent counseling and coordinating the care for this patient.  Greater than  50% of the time was spent in direct face-to-face contact.

## 2024-02-04 ENCOUNTER — Ambulatory Visit: Payer: Self-pay

## 2024-02-05 ENCOUNTER — Ambulatory Visit: Payer: Self-pay

## 2024-02-06 ENCOUNTER — Ambulatory Visit: Payer: Self-pay | Admitting: Family Medicine

## 2024-02-06 ENCOUNTER — Encounter: Payer: Self-pay | Admitting: Family Medicine

## 2024-02-06 VITALS — BP 107/72 | HR 80 | Temp 97.5°F | Wt 166.2 lb

## 2024-02-06 DIAGNOSIS — O0940 Supervision of pregnancy with grand multiparity, unspecified trimester: Secondary | ICD-10-CM

## 2024-02-06 DIAGNOSIS — O99810 Abnormal glucose complicating pregnancy: Secondary | ICD-10-CM

## 2024-02-06 DIAGNOSIS — O0943 Supervision of pregnancy with grand multiparity, third trimester: Secondary | ICD-10-CM

## 2024-02-06 DIAGNOSIS — Z3A33 33 weeks gestation of pregnancy: Secondary | ICD-10-CM

## 2024-02-06 DIAGNOSIS — O24419 Gestational diabetes mellitus in pregnancy, unspecified control: Secondary | ICD-10-CM

## 2024-02-06 LAB — URINALYSIS
Bilirubin, UA: NEGATIVE
Glucose, UA: NEGATIVE
Ketones, UA: NEGATIVE
Nitrite, UA: NEGATIVE
Protein,UA: NEGATIVE
RBC, UA: NEGATIVE
Specific Gravity, UA: <1.005 — ABNORMAL LOW (ref 1.005–1.030)
Urobilinogen, Ur: 0.2 mg/dL (ref 0.2–1.0)
pH, UA: 5.5 (ref 5.0–7.5)

## 2024-02-06 NOTE — Progress Notes (Signed)
 Here for MH RV at 33 5/7. BS log copied and needs urine dip today. Kept 02/01/24 U/S and 01/31/24 [facility] appointments. Rosiland Cooks, RN

## 2024-02-06 NOTE — Progress Notes (Signed)
Urine dip reviewed with provider.Burt Knack, RN

## 2024-02-06 NOTE — Progress Notes (Signed)
 [facility] [address] [place] [address] Main phone: [phone]  Prenatal Visit  Subjective:  Kristine Blake is a 33 y.o. [id number] at [redacted]w[redacted]d being seen today for ongoing prenatal care.  She is currently monitored for the following issues for this low-risk pregnancy:   Patient Active Problem List   Diagnosis Date Noted   Gestational diabetes mellitus (GDM) in third trimester 01/24/2024   Late prenatal care at approx 22 weeks 11/22/2023   Encounter for supervision of high risk pregnancy with grand multiparity, antepartum 11/22/2023   Overweight- pregravid BMI 28.55 11/22/2023   Stress 11/22/2023   History of postpartum hemorrhage  (2019) 03/06/2019   Herpes 03/03/2019   Patient reports no complaints.  Contractions: Irregular. Vag. Bleeding: None.  Movement: Present. Denies leaking of fluid/ROM.   The following portions of the patient's history were reviewed and updated as appropriate: allergies, current medications, past family history, past medical history, past social history, past surgical history and problem list. Problem list updated.  Objective:   Vitals:   02/06/24 0909  BP: 107/72  Pulse: 80  Temp: (!) 97.5 F (36.4 C)  Weight: 166 lb 3.2 oz (75.4 kg)    Fetal Status: Fetal Heart Rate (bpm): 145 Fundal Height: 34 cm Movement: Present     General:  Alert, oriented and cooperative. Patient is in no acute distress.  Skin: Skin is warm and dry. No rash noted.   Cardiovascular: Normal heart rate noted  Respiratory: Normal respiratory effort, no problems with respiration noted  Abdomen: Soft, gravid, appropriate for gestational age.  Pain/Pressure: Present     Pelvic: Cervical exam deferred        Extremities: Normal range of motion.  Edema: None  Mental Status: Normal mood and affect. Normal behavior. Normal judgment and thought content.   Assessment and Plan:  Pregnancy: G7P5015  at [redacted]w[redacted]d  1. [redacted] weeks gestation of pregnancy (Primary)   2. Encounter for supervision of high risk pregnancy with grand multiparity, antepartum 15 lb 3.2 oz (6.895 kg) -taking PNV daily   3. Gestational diabetes mellitus (GDM) in third trimester, gestational diabetes method of control unspecified Blood sugar log values below. Encouraged continued daily exercise and diet modifications. 0 of  7 FBS values are abnormal (range was 83 to 88) 2 of 18 2hr pp values are abnormal (range was 89 to 122) Exercise: walks 20. minutes 2-3 d/week, counseled to increase to 3-4 days Diet recall :   Breakfast = whole grain toast plain with coffee 1% milk and stevia                      Lunch = chicken with veggies and small amt of white rice                      Dinner = chicken with veggies                       Snack =  Water: drinks about  2-3 bottles of water/day and night - counseled to increase water to 5-6 bottles   UA reviewed=negative for glucose  -lifestyles on 6/5 -US  on 6/6- EFW 66%; declined repeat growth US  later in pregnancy -delivery around 39 weeks with good BS control- patient strongly against being induced- reports previously deliveries all by [redacted]w[redacted]d -reviewed that we can discuss this closer to her due date, as things may change depending  on how controlled her GDM remains over the next few weeks  4. Abnormal glucose affecting pregnancy  - Urinalysis   Preterm labor symptoms and general obstetric precautions including but not limited to vaginal bleeding, contractions, leaking of fluid and fetal movement were reviewed in detail with the patient. Please refer to After Visit Summary for other counseling recommendations.  Return in about 1 week (around 02/13/2024) for Routine Prenatal Care.  Future Appointments  Date Time Provider Department Center  02/14/2024 10:00 AM [facility] PROVIDER AC-MAT None   Due to language barrier, a Spanish interpreter Woodward Head.) was present in person during  the history-taking, subsequent discussion, and physical exam with this patient.   Earleen Glazier, [place]

## 2024-02-07 ENCOUNTER — Ambulatory Visit: Payer: Self-pay

## 2024-02-13 NOTE — Progress Notes (Unsigned)
 [facility] [address] [place] [address] Main phone: [phone]  Prenatal Visit  Subjective:  Kristine Blake is a 33 y.o. O1H0865 at [redacted]w[redacted]d being seen today for ongoing prenatal care.  She is currently monitored for the following issues for this high-risk pregnancy:   Patient Active Problem List   Diagnosis Date Noted   Gestational diabetes mellitus (GDM) in third trimester 01/24/2024   Late prenatal care at approx 22 weeks 11/22/2023   Encounter for supervision of high risk pregnancy with grand multiparity, antepartum 11/22/2023   Overweight- pregravid BMI 28.55 11/22/2023   Stress 11/22/2023   History of postpartum hemorrhage  (2019) 03/06/2019   Herpes 03/03/2019   Patient reports {sx:14538}.   .  .   . ***Denies leaking of fluid/ROM.   The following portions of the patient's history were reviewed and updated as appropriate: allergies, current medications, past family history, past medical history, past social history, past surgical history and problem list. Problem list updated.  Objective:   There were no vitals filed for this visit.  Fetal Status:           General:  Alert, oriented and cooperative. Patient is in no acute distress.  Skin: Skin is warm and dry. No rash noted.   Cardiovascular: Normal heart rate noted  Respiratory: Normal respiratory effort, no problems with respiration noted  Abdomen: Soft, gravid, appropriate for gestational age.        Pelvic: Cervical exam performed        Extremities: Normal range of motion.     Mental Status: Normal mood and affect. Normal behavior. Normal judgment and thought content.   Assessment and Plan:  Pregnancy: G7P5015 at [redacted]w[redacted]d  1. [redacted] weeks gestation of pregnancy (Primary) RV in 1 week.   2. Encounter for supervision of high risk pregnancy with grand multiparity, antepartum Reports taking PNV daily------------- BP  today---------------, reassuring? Total Weight Gain:  Reviewed the following concerns today:   3. Gestational diabetes mellitus (GDM) in third trimester, gestational diabetes method of control unspecified Blood sugar log values below. Encouraged continued daily exercise and diet modifications. *** of  *** FBS values are abnormal (range was *** to ***) ***of *** 2hr pp values are abnormal (range was *** to ***) Exercise: walks {0-35:19561} minutes per day Diet recall :   Breakfast =                       Lunch =                       Dinner =                       Snack =  Water: drinks about  {NUMBERS:20191} bottles of water/day and night   UA today?  Patient declined repeat growth US  after most recent (02/01/24). --------- Has patient changed her mind? Recommendation at 02/01/24 US  was delivery around 39 weeks with good BS control. Patient voiced not wanting induction. --------- Has patient changed her mind? Does she need a delivery plans appointment?  4. Herpes Plan to begin suppression therapy at [redacted] weeks gestation.    Preterm labor symptoms and general obstetric precautions including but not limited to vaginal bleeding, contractions, leaking of fluid and fetal movement were reviewed in detail with the patient. Please refer to After Visit Summary for other counseling recommendations.  No follow-ups on  file.  Future Appointments  Date Time Provider Department Center  02/14/2024 10:00 AM [facility] PROVIDER AC-MAT None   Due to language barrier, a Spanish interpreter ({achd interps:32101}) was present {achd interp options:32100::in person} during the history-taking, subsequent discussion, and physical exam with this patient.    Merleen Stare, NP

## 2024-02-14 ENCOUNTER — Ambulatory Visit: Payer: Self-pay | Admitting: Nurse Practitioner

## 2024-02-14 VITALS — BP 105/67 | HR 100 | Temp 97.3°F | Wt 166.8 lb

## 2024-02-14 DIAGNOSIS — O0943 Supervision of pregnancy with grand multiparity, third trimester: Secondary | ICD-10-CM

## 2024-02-14 DIAGNOSIS — Z3A34 34 weeks gestation of pregnancy: Secondary | ICD-10-CM

## 2024-02-14 DIAGNOSIS — O24419 Gestational diabetes mellitus in pregnancy, unspecified control: Secondary | ICD-10-CM

## 2024-02-14 DIAGNOSIS — B009 Herpesviral infection, unspecified: Secondary | ICD-10-CM

## 2024-02-14 DIAGNOSIS — O0940 Supervision of pregnancy with grand multiparity, unspecified trimester: Secondary | ICD-10-CM

## 2024-02-14 LAB — URINALYSIS
Bilirubin, UA: NEGATIVE
Glucose, UA: NEGATIVE
Ketones, UA: NEGATIVE
Nitrite, UA: NEGATIVE
Protein,UA: NEGATIVE
RBC, UA: NEGATIVE
Specific Gravity, UA: 1.015 (ref 1.005–1.030)
Urobilinogen, Ur: 0.2 mg/dL (ref 0.2–1.0)
pH, UA: 6 (ref 5.0–7.5)

## 2024-02-14 NOTE — Progress Notes (Addendum)
 Kept 01/31/24 [facility] appt. Kept 02/01/24 [facility]  [place] MD consult / US  / BP check appt. Urine dip reviewed by Catarina Cline FNP-C. Blood sugar log copied and in outguide for provider review. Ariel Begun, RN Client needs 1 week MHC RV appt (GDM). Client is adamant that cannot come to clinic in the pm. No available MHC appts Thursday due to [facility]. Client preferred to come Wednesady am versus Friday am. Client aware this is a work-in appt. Ariel Begun, RN

## 2024-02-17 ENCOUNTER — Ambulatory Visit: Payer: Self-pay

## 2024-02-20 ENCOUNTER — Ambulatory Visit: Payer: Self-pay | Admitting: Family Medicine

## 2024-02-20 ENCOUNTER — Encounter: Payer: Self-pay | Admitting: Family Medicine

## 2024-02-20 ENCOUNTER — Ambulatory Visit: Payer: Self-pay

## 2024-02-20 VITALS — BP 89/60 | HR 79 | Temp 97.3°F | Wt 165.5 lb

## 2024-02-20 DIAGNOSIS — O0940 Supervision of pregnancy with grand multiparity, unspecified trimester: Secondary | ICD-10-CM

## 2024-02-20 DIAGNOSIS — Z3A35 35 weeks gestation of pregnancy: Secondary | ICD-10-CM

## 2024-02-20 DIAGNOSIS — B009 Herpesviral infection, unspecified: Secondary | ICD-10-CM

## 2024-02-20 DIAGNOSIS — O24419 Gestational diabetes mellitus in pregnancy, unspecified control: Secondary | ICD-10-CM

## 2024-02-20 DIAGNOSIS — O0943 Supervision of pregnancy with grand multiparity, third trimester: Secondary | ICD-10-CM

## 2024-02-20 LAB — URINALYSIS
Bilirubin, UA: NEGATIVE
Glucose, UA: NEGATIVE
Ketones, UA: NEGATIVE
Leukocytes,UA: NEGATIVE
Nitrite, UA: NEGATIVE
Protein,UA: NEGATIVE
RBC, UA: NEGATIVE
Specific Gravity, UA: 1.01 (ref 1.005–1.030)
Urobilinogen, Ur: 0.2 mg/dL (ref 0.2–1.0)
pH, UA: 6.5 (ref 5.0–7.5)

## 2024-02-20 MED ORDER — ACYCLOVIR 400 MG PO TABS: 400.0000 mg | ORAL_TABLET | Freq: Three times a day (TID) | ORAL | Status: DC

## 2024-02-20 NOTE — Progress Notes (Signed)
 [facility] [address] [place] [address] Main phone: [phone]  Prenatal Visit  Subjective:  Kristine Blake is a 33 y.o. H2E4984 at 103w5d being seen today for ongoing prenatal care.  She is currently monitored for the following issues for this low-risk pregnancy:   Patient Active Problem List   Diagnosis Date Noted   Gestational diabetes mellitus (GDM) in third trimester 01/24/2024   Late prenatal care at approx 22 weeks 11/22/2023   Encounter for supervision of high risk pregnancy with grand multiparity, antepartum 11/22/2023   Overweight- pregravid BMI 28.55 11/22/2023   Stress 11/22/2023   History of postpartum hemorrhage  (2019) 03/06/2019   Herpes 03/03/2019   Patient reports no complaints.  Contractions: Not present. Vag. Bleeding: None.  Movement: Present. Denies leaking of fluid/ROM.   The following portions of the patient's history were reviewed and updated as appropriate: allergies, current medications, past family history, past medical history, past social history, past surgical history and problem list. Problem list updated.  Objective:   Vitals:   02/20/24 0930  BP: (!) 89/60  Pulse: 79  Temp: (!) 97.3 F (36.3 C)  Weight: 165 lb 8 oz (75.1 kg)    Fetal Status: Fetal Heart Rate (bpm): 141 Fundal Height: 37 cm Movement: Present     General:  Alert, oriented and cooperative. Patient is in no acute distress.  Skin: Skin is warm and dry. No rash noted.   Cardiovascular: Normal heart rate noted  Respiratory: Normal respiratory effort, no problems with respiration noted  Abdomen: Soft, gravid, appropriate for gestational age.  Pain/Pressure: Present     Pelvic: Cervical exam deferred        Extremities: Normal range of motion.  Edema: None  Mental Status: Normal mood and affect. Normal behavior. Normal judgment and thought content.   Assessment and Plan:  Pregnancy:  G7P5015 at [redacted]w[redacted]d  1. [redacted] weeks gestation of pregnancy (Primary) -RTC in 1 week for 36 week cultures   2. Encounter for supervision of high risk pregnancy with grand multiparity, antepartum -reports occasional insomnia- reviewed tips for better sleep  3. Gestational diabetes mellitus (GDM) in third trimester, gestational diabetes method of control unspecified Blood sugar log values below. Encouraged continued daily exercise and diet modifications. 1 of  7 FBS values are abnormal (range was 89 to 95) 1 of 18 2hr pp values are abnormal (range was 98 to 121) Exercise: walks 20.-30 minutes 5 days per week Diet recall :   Breakfast = eggs with spinach, coffee with 1% milk and stevia                      Lunch = eggs with beans and 1 tortilla                      Dinner = grilled chicken with vegetables                      Snack = apple Water: drinks about  4 bottles of water/day and night  - Urinalysis (Urine Dip)- reviewed- negative for glucose  4. Herpes The patient was dispensed Acyclovir  today. I provided counseling today regarding the medication. We discussed the medication, the side effects and when to call clinic. Patient given the opportunity to ask questions. Questions answered.   - acyclovir  (ZOVIRAX ) 400 MG tablet; Take 1 tablet (400 mg total) by mouth 3 (three) times  daily.   Preterm labor symptoms and general obstetric precautions including but not limited to vaginal bleeding, contractions, leaking of fluid and fetal movement were reviewed in detail with the patient. Please refer to After Visit Summary for other counseling recommendations.  Return in about 1 week (around 02/27/2024) for Routine Prenatal Care.  Future Appointments  Date Time Provider Department Center  02/27/2024  8:20 AM [facility] PROVIDER AC-MAT None  Due to language barrier, a Spanish interpreter Kemp T.) was present in person during the history-taking, subsequent discussion, and physical exam with this patient.     Verneta Bers, [place]

## 2024-02-20 NOTE — Progress Notes (Addendum)
 Urine dip today. Blood sugar log copied and in outguide. 1 week MHC RV appt scheduled and reminder card given. Burnadette Lowers, RN Acyclovir  dispensed as per HILARIO Bers FNP-C order and client aware when to begin taking (02/22/24). Burnadette Lowers, RN

## 2024-02-26 NOTE — Progress Notes (Signed)
 [facility] [address] [place] [address] Main phone: [phone]  Prenatal Visit  Subjective:  Kristine Blake is a 33 y.o. H2E4984 at [redacted]w[redacted]d being seen today for ongoing prenatal care.  She is currently monitored for the following issues for this high-risk pregnancy:   Patient Active Problem List   Diagnosis Date Noted   Gestational diabetes mellitus (GDM) in third trimester 01/24/2024   Late prenatal care at approx 22 weeks 11/22/2023   Encounter for supervision of high risk pregnancy with grand multiparity, antepartum 11/22/2023   Overweight- pregravid BMI 28.55 11/22/2023   Stress 11/22/2023   History of postpartum hemorrhage  (2019) 03/06/2019   Herpes 03/03/2019   Patient reports {sx:14538}.   .  .   . ***Denies leaking of fluid/ROM.   The following portions of the patient's history were reviewed and updated as appropriate: allergies, current medications, past family history, past medical history, past social history, past surgical history and problem list. Problem list updated.  Objective:   There were no vitals filed for this visit.  Fetal Status:           General:  Alert, oriented and cooperative. Patient is in no acute distress.  Skin: Skin is warm and dry. No rash noted.   Cardiovascular: Normal heart rate noted  Respiratory: Normal respiratory effort, no problems with respiration noted  Abdomen: Soft, gravid, appropriate for gestational age.        Pelvic: Cervical exam deferred        Extremities: Normal range of motion.     Mental Status: Normal mood and affect. Normal behavior. Normal judgment and thought content.   Assessment and Plan:  Pregnancy: G7P5015 at [redacted]w[redacted]d  1. [redacted] weeks gestation of pregnancy (Primary) RV in 1 week.  36 week labs collected today.   2. Encounter for supervision of high risk pregnancy with grand multiparity, antepartum Patient reports concerns /  no concerns today. *** How does patient feel mentally? BP=***, reassuring. Denies s/s of Pre-E*** Reports taking PNV daily.   3. Gestational diabetes mellitus (GDM) in third trimester, gestational diabetes method of control unspecified Urine dip today Negative in office. Blood sugar log values below. Encouraged continued daily exercise and diet modifications. *** of  *** FBS values are abnormal (range was *** to ***) ***of *** 2hr pp values are abnormal (range was *** to ***) Exercise: walks {0-35:19561} minutes per day Diet recall :   Breakfast =                       Lunch =                       Dinner =                       Snack =  Water: drinks about  {NUMBERS:20191} bottles of water/day and night   Review of BS log indicates good blood surgar control and no treatment with medication needed at this time. Plan to continue managing gestational diabetes with lifestyle modifications (diet and exercise).   Patient has declined repeat US  and delivery plans appt to discuss induction at 39 weeks. She verbalizes that she does not want to be induced.   4. Herpes Patient dispensed Acyclovir  at last prenatal appointment and advised to begin taking 06/27. Patient reports taking TID as instructed. She verifies understanding she will take until  delivery.   5. Overweight (BMI 25.0-29.9) Total Weight Gain:  *** lb {jigainloss:32889} in previous 1 week  Pre-gravid BMI 28.55 overweight. Expected weight gain this pregnancy 15-25 pounds.  Preterm labor symptoms and general obstetric precautions including but not limited to vaginal bleeding, contractions, leaking of fluid and fetal movement were reviewed in detail with the patient. Please refer to After Visit Summary for other counseling recommendations.   No follow-ups on file.  Future Appointments  Date Time Provider Department Center  02/27/2024  8:20 AM [facility] PROVIDER AC-MAT None   Due to language barrier, a Spanish interpreter ({achd  interps:32101}) was present {achd interp options:32100::in person} during the history-taking, subsequent discussion, and physical exam with this patient.    Clarita LITTIE Narrow, NP

## 2024-02-27 ENCOUNTER — Ambulatory Visit: Payer: Self-pay | Admitting: Nurse Practitioner

## 2024-02-27 ENCOUNTER — Encounter: Payer: Self-pay | Admitting: Nurse Practitioner

## 2024-02-27 VITALS — BP 108/73 | HR 80 | Temp 97.5°F | Wt 165.8 lb

## 2024-02-27 DIAGNOSIS — Z3A36 36 weeks gestation of pregnancy: Secondary | ICD-10-CM

## 2024-02-27 DIAGNOSIS — O0943 Supervision of pregnancy with grand multiparity, third trimester: Secondary | ICD-10-CM

## 2024-02-27 DIAGNOSIS — B009 Herpesviral infection, unspecified: Secondary | ICD-10-CM

## 2024-02-27 DIAGNOSIS — O0940 Supervision of pregnancy with grand multiparity, unspecified trimester: Secondary | ICD-10-CM

## 2024-02-27 DIAGNOSIS — Z3201 Encounter for pregnancy test, result positive: Secondary | ICD-10-CM

## 2024-02-27 DIAGNOSIS — E663 Overweight: Secondary | ICD-10-CM

## 2024-02-27 DIAGNOSIS — O24419 Gestational diabetes mellitus in pregnancy, unspecified control: Secondary | ICD-10-CM

## 2024-02-27 LAB — URINALYSIS
Bilirubin, UA: NEGATIVE
Glucose, UA: NEGATIVE
Ketones, UA: NEGATIVE
Leukocytes,UA: NEGATIVE
Nitrite, UA: NEGATIVE
Protein,UA: NEGATIVE
RBC, UA: NEGATIVE
Specific Gravity, UA: 1.01 (ref 1.005–1.030)
Urobilinogen, Ur: 0.2 mg/dL (ref 0.2–1.0)
pH, UA: 6 (ref 5.0–7.5)

## 2024-02-27 MED ORDER — PRENATAL 27-0.8 MG PO TABS: 1.0000 | ORAL_TABLET | Freq: Every day | ORAL | 1 refills | Status: DC

## 2024-02-27 NOTE — Progress Notes (Addendum)
 Here for MH RV at 36 5/7. 36 week labs today. Packet given and reviewed with patient. BS log copied and urine dip ordered.Dala -collecting 36 week labs.SABRASABRAIzetta Parish, RN

## 2024-02-28 ENCOUNTER — Ambulatory Visit: Payer: Self-pay

## 2024-03-06 ENCOUNTER — Telehealth: Payer: Self-pay | Admitting: Family Medicine

## 2024-03-06 LAB — CHLAMYDIA/GC NAA, CONFIRMATION
Chlamydia trachomatis, NAA: NEGATIVE
Neisseria gonorrhoeae, NAA: NEGATIVE

## 2024-03-06 LAB — CULTURE, BETA STREP (GROUP B ONLY)

## 2024-03-06 NOTE — Telephone Encounter (Signed)
 Called [facility] regarding missing GBS result from 7/02. G/C screening from same day was resulted on 03/01/24.  [facility] representative reports it is still showing active in their system. Their records indicate they received both a vaginal NAAT swab and a culture media swab. I clarified that the NAAT should have been the [facility] screen and the culture should have been for the GBS testing.   After discussed with lab tech, it was discovered that this was a miss on their end. Something was not keyed in correctly when the samples were received. This sample is now beyond viability and we will need to recollect the sample in clinic at next pnv.   Dorothyann Helling, MD 03/06/24  11:59 AM

## 2024-03-07 ENCOUNTER — Ambulatory Visit: Payer: Self-pay | Admitting: Nurse Practitioner

## 2024-03-07 VITALS — BP 108/74 | HR 77 | Temp 97.3°F | Wt 167.0 lb

## 2024-03-07 DIAGNOSIS — E663 Overweight: Secondary | ICD-10-CM

## 2024-03-07 DIAGNOSIS — O0943 Supervision of pregnancy with grand multiparity, third trimester: Secondary | ICD-10-CM

## 2024-03-07 DIAGNOSIS — O24419 Gestational diabetes mellitus in pregnancy, unspecified control: Secondary | ICD-10-CM

## 2024-03-07 DIAGNOSIS — Z3A38 38 weeks gestation of pregnancy: Secondary | ICD-10-CM

## 2024-03-07 DIAGNOSIS — B009 Herpesviral infection, unspecified: Secondary | ICD-10-CM

## 2024-03-07 DIAGNOSIS — O0940 Supervision of pregnancy with grand multiparity, unspecified trimester: Secondary | ICD-10-CM

## 2024-03-07 LAB — URINALYSIS
Bilirubin, UA: NEGATIVE
Glucose, UA: NEGATIVE
Ketones, UA: NEGATIVE
Leukocytes,UA: NEGATIVE
Nitrite, UA: NEGATIVE
Protein,UA: NEGATIVE
RBC, UA: NEGATIVE
Specific Gravity, UA: 1.01 (ref 1.005–1.030)
Urobilinogen, Ur: 0.2 mg/dL (ref 0.2–1.0)
pH, UA: 6 (ref 5.0–7.5)

## 2024-03-07 NOTE — Progress Notes (Signed)
 Urine result reviewed during visit. Consuelo Kingsley RN

## 2024-03-07 NOTE — Progress Notes (Signed)
  [facility] [address] [place] [address] Main phone: [phone]  Prenatal Visit  Subjective:  Kristine Blake is a 33 y.o. H2E4984 at [redacted]w[redacted]d being seen today for ongoing prenatal care.  She is currently monitored for the following issues for this high-risk pregnancy:   Patient Active Problem List   Diagnosis Date Noted   Gestational diabetes mellitus (GDM) in third trimester 01/24/2024   Late prenatal care at approx 22 weeks 11/22/2023   Encounter for supervision of high risk pregnancy with grand multiparity, antepartum 11/22/2023   Overweight- pregravid BMI 28.55 11/22/2023   Stress 11/22/2023   History of postpartum hemorrhage  (2019) 03/06/2019   Herpes 03/03/2019   Patient reports hemmorhoids.  Contractions: Irregular. Vag. Bleeding: None.  Movement: Present. Denies leaking of fluid/ROM.   The following portions of the patient's history were reviewed and updated as appropriate: allergies, current medications, past family history, past medical history, past social history, past surgical history and problem list. Problem list updated.  Objective:   Vitals:   03/07/24 0829  BP: 108/74  Pulse: 77  Temp: (!) 97.3 F (36.3 C)  Weight: 167 lb (75.8 kg)    Fetal Status: Fetal Heart Rate (bpm): 143 Fundal Height: 39 cm Movement: Present  Presentation: Vertex  General:  Alert, oriented and cooperative. Patient is in no acute distress.  Skin: Skin is warm and dry. No rash noted.   Cardiovascular: Normal heart rate noted  Respiratory: Normal respiratory effort, no problems with respiration noted  Abdomen: Soft, gravid, appropriate for gestational age.  Pain/Pressure: Absent     Pelvic: Cervical exam deferred        Extremities: Normal range of motion.  Edema: Trace  Mental Status: Normal mood and affect. Normal behavior. Normal judgment and thought content.   Assessment and Plan:   Pregnancy: H2E4984 at [redacted]w[redacted]d  1. Encounter for supervision of high risk pregnancy with grand multiparity, antepartum (Primary) Discussed that hemorrhoids are common in pregnancy and why they happen. She is using hemorrhoid cream now. Discussed that there is a surgery to remove hemorrhoids, but I recommend waiting until no longer childbearing as they tend to recur. Pt denies that hemorrhoids are painful.  2. Gestational diabetes mellitus (GDM) in third trimester, gestational diabetes method of control unspecified BS logs scanned in.  Over 2 weeks of checking 4 times daily only 6 values barely elevated. (2hr postprandial - 120, 120, 124, 125 & fasting was 95 twice) UA negative - Urinalysis  3. Herpes Pt taking suppressive therapy  4. Overweight- pregravid BMI 28.55 16 lb (7.258 kg) Gained 2lbs in 2 weeks  5. [redacted] weeks gestation of pregnancy Has crib and car seat and hospital bag packed.   Pt self swabbed again for GBS as the lab did not process her last sample. - Culture, beta strep (group b only)  Due to language barrier, a Spanish interpreter Kemp T.) was present in person during the history-taking, subsequent discussion, and physical exam with this patient.    Term labor symptoms and general obstetric precautions including but not limited to vaginal bleeding, contractions, leaking of fluid and fetal movement were reviewed in detail with the patient. Please refer to After Visit Summary for other counseling recommendations.  Return in about 1 week (around 03/14/2024) for Routine PNC.  No future appointments.  Thereasa Iannello K Valen Gillison, NP

## 2024-03-11 ENCOUNTER — Ambulatory Visit: Payer: Self-pay

## 2024-03-11 DIAGNOSIS — O0940 Supervision of pregnancy with grand multiparity, unspecified trimester: Secondary | ICD-10-CM

## 2024-03-11 LAB — CULTURE, BETA STREP (GROUP B ONLY): Strep Gp B Culture: NEGATIVE

## 2024-03-11 NOTE — Progress Notes (Signed)
 Negative UA and GBS screen.   Dorothyann Helling, MD 03/11/24  9:05 PM

## 2024-03-11 NOTE — Progress Notes (Signed)
 GBS missing, recollected 7/11. Will follow results. Other labs negative.   Dorothyann Helling, MD 03/11/24  2:37 PM

## 2024-03-14 ENCOUNTER — Ambulatory Visit: Payer: Self-pay

## 2024-03-14 ENCOUNTER — Ambulatory Visit: Payer: Self-pay | Admitting: Nurse Practitioner

## 2024-03-14 VITALS — BP 113/78 | HR 64 | Temp 97.3°F | Wt 166.8 lb

## 2024-03-14 DIAGNOSIS — B009 Herpesviral infection, unspecified: Secondary | ICD-10-CM

## 2024-03-14 DIAGNOSIS — O24419 Gestational diabetes mellitus in pregnancy, unspecified control: Secondary | ICD-10-CM

## 2024-03-14 DIAGNOSIS — O0943 Supervision of pregnancy with grand multiparity, third trimester: Secondary | ICD-10-CM

## 2024-03-14 DIAGNOSIS — Z3A39 39 weeks gestation of pregnancy: Secondary | ICD-10-CM

## 2024-03-14 DIAGNOSIS — O0940 Supervision of pregnancy with grand multiparity, unspecified trimester: Secondary | ICD-10-CM

## 2024-03-14 LAB — URINALYSIS
Bilirubin, UA: NEGATIVE
Glucose, UA: NEGATIVE
Ketones, UA: NEGATIVE
Leukocytes,UA: NEGATIVE
Nitrite, UA: NEGATIVE
Protein,UA: NEGATIVE
RBC, UA: NEGATIVE
Specific Gravity, UA: 1.01 (ref 1.005–1.030)
Urobilinogen, Ur: 0.2 mg/dL (ref 0.2–1.0)
pH, UA: 6 (ref 5.0–7.5)

## 2024-03-14 NOTE — Progress Notes (Signed)
 BS Log collected. Urine Dip results reviewed. Consuelo Kingsley RN

## 2024-03-14 NOTE — Progress Notes (Signed)
 [facility] [address], [address] [place] [address] Main phone: [phone]  Prenatal Visit  Subjective:  Kristine Blake is a 33 y.o. H2E4984 at 100w0d being seen today for ongoing prenatal care.  She is currently monitored for the following issues for this high-risk pregnancy:   Patient Active Problem List   Diagnosis Date Noted   Gestational diabetes mellitus (GDM) in third trimester 01/24/2024   Late prenatal care at approx 22 weeks 11/22/2023   Encounter for supervision of high risk pregnancy with grand multiparity, antepartum 11/22/2023   Overweight- pregravid BMI 28.55 11/22/2023   Stress 11/22/2023   History of postpartum hemorrhage  (2019) 03/06/2019   Herpes 03/03/2019   Patient reports fatigue. She has been getting up frequently at night to void, but is able to nap some during the day. She also notes that her legs have mild pain in them when she gets up at night. She denies any redness, warmth, or tenderness to touch in her legs. Contractions: Irregular. Vag. Bleeding: None.  Movement: Present. Denies leaking of fluid/ROM.   The following portions of the patient's history were reviewed and updated as appropriate: allergies, current medications, past family history, past medical history, past social history, past surgical history and problem list. Problem list updated.  Objective:   Vitals:   03/14/24 0822  BP: 113/78  Pulse: 64  Temp: (!) 97.3 F (36.3 C)  Weight: 166 lb 12.8 oz (75.7 kg)    Fetal Status: Fetal Heart Rate (bpm): 135 Fundal Height: 37 cm Movement: Present  Presentation: Vertex  General:  Alert, oriented and cooperative. Patient is in no acute distress.  Skin: Skin is warm and dry. No rash noted.   Cardiovascular: Normal heart rate noted  Respiratory: Normal respiratory effort, no problems with respiration noted  Abdomen: Soft, gravid, appropriate for gestational age.   Pain/Pressure: Absent     Pelvic: Cervical exam performed Dilation: 1 Effacement (%): 50 Station: 0  Extremities: Normal range of motion.  Edema: None  Mental Status: Normal mood and affect. Normal behavior. Normal judgment and thought content.   Assessment and Plan:  Pregnancy: H2E4984 at [redacted]w[redacted]d  1. Gestational diabetes mellitus (GDM) in third trimester, gestational diabetes method of control unspecified (Primary) BS log scanned in BS seem well controlled with fasting going no higher than 95 and 6 elevated 2hr postprandial not exceeding 125 UA negative - Urinalysis (Urine Dip)  2. Encounter for supervision of high risk pregnancy with grand multiparity, antepartum 15 lb 12.8 oz (7.167 kg) Lost 1 lb in 1 wk Discussed difficulty sleeping at full term and recommended 2-3mg  of OTC melatonin at bedtime, tapering water intake toward night, and napping whenever possible during the day.   3. Herpes Taking suppressive therapy  4. [redacted] weeks gestation of pregnancy -Membrane sweep discussed including pros (may induce labor w/i 48hrs) and cons (may accidentally break amniotic sac, may be uncomfortable, may not induce labor). Membrane sweep is optional and can be stopped at any time. -Pt desires membrane sweep -Membrane sweep performed but unable to be thorough d/t positioning of the cervix and the baby's head  Discussed induction. Pt's BS are well controlled and per Up To Date no strong evidence for a specific GA at IOL for A1GDM. Pt preference is for 40 weeks. Faxed IOL sheet to [facility] for induction at 40 weeks. Asked pt to come back next week for ROB if no induction or 6 weeks after she has the  baby for her postpartum visit.  Due to language barrier, a Spanish interpreter Kemp T.) was present in person during the history-taking, subsequent discussion, and physical exam with this patient.    Term labor symptoms and general obstetric precautions including but not limited to vaginal bleeding,  contractions, leaking of fluid and fetal movement were reviewed in detail with the patient. Please refer to After Visit Summary for other counseling recommendations.  Return in about 1 week (around 03/21/2024) for Routine PNC.  Future Appointments  Date Time Provider Department Center  03/21/2024  2:00 PM [facility] PROVIDER [facility] None    Leita MARLA Bolognese, NP

## 2024-03-19 ENCOUNTER — Telehealth: Payer: Self-pay

## 2024-03-19 NOTE — Telephone Encounter (Signed)
 Call to [facility] to speak with Harlene Gander who made call to client regarding 03/20/24 IOL which she declined. Per Harlene, she did call back to client and counseled her to discuss IOL with [facility] provider at her appt 03/21/24. Secure chat sent to Dr. Macario regarding declination of IOL date. Per Harlene, call [facility] when client at 03/21/24 appt at [facility] regarding IOL date. Burnadette Lowers, RN

## 2024-03-19 NOTE — Telephone Encounter (Signed)
 Due to language barrier, a Spanish interpreter Kandra, [place] [id number]) was present by phone during the phone call.   First called [facility] L&D - couple appointments available on Saturday, more available on Monday.  Next called Ms. Reyes. Patient relays she would accept induction on Saturday or Monday (both days [facility] L&D said were available).   Next called [facility] L&D to ask for [facility] on-call. Aldona, on call CNM with [facility], answered. She relays we need to call [facility] clinic directly to re-submit a new induction request.   Next called [facility] at [phone], option #2. Was transferred to [employer]. Harlene reports she will call L&D to move the appointment for induction to Saturday.   Dorothyann Helling, MD 03/19/24  3:47 PM

## 2024-03-19 NOTE — Telephone Encounter (Signed)
 Called L&D and let them know pt declined induction date. Also [facility] Department and made them aware Pt declined induction.

## 2024-03-19 NOTE — Telephone Encounter (Signed)
 Per Dwayne - [facility] intake clerk, received call from [facility] stating client declined IOL date of 03/20/24. Client has GDM and EGA 03/20/24 = 39 6/7. Call to client with Marlene Yemen and client states unable to have baby tomorrow because it is her daughter's birthday and her husband has an appt. Counseled on recommendation for IOL as has GDM. Client states does not want induction tomorrow. Client then stated was receiving a call back (from [employer]). Call disconnected and client aware we will call her back. Call then made to [facility], but recorded message states they are at lunch. Will attempt call later this pm. Burnadette Lowers, RN

## 2024-03-19 NOTE — Telephone Encounter (Signed)
 Pt aware her induction is now set up for Monday 03/24/24 at 5am.

## 2024-03-19 NOTE — Telephone Encounter (Signed)
 Call transferred to clinic by intake clerk Nena who interpreted during the call. Per Marit, can't have IOL 03/20/24 as husband taking [place] the consulate [place] immigration status and states will keep her appt in maternity clinic 03/21/24. Client counseled that above was discussed in phone call earlier today and asked if a new concern. Client answered no. Client then reminded of 1345 arrival time to [facility] Friday and she states will be at appt. Burnadette Lowers, RN

## 2024-03-20 ENCOUNTER — Encounter: Payer: Self-pay | Admitting: Obstetrics

## 2024-03-21 ENCOUNTER — Ambulatory Visit: Payer: Self-pay | Admitting: Family Medicine

## 2024-03-21 VITALS — BP 109/74 | HR 93 | Temp 97.9°F | Wt 167.6 lb

## 2024-03-21 DIAGNOSIS — O0930 Supervision of pregnancy with insufficient antenatal care, unspecified trimester: Secondary | ICD-10-CM

## 2024-03-21 DIAGNOSIS — Z8759 Personal history of other complications of pregnancy, childbirth and the puerperium: Secondary | ICD-10-CM

## 2024-03-21 DIAGNOSIS — O0940 Supervision of pregnancy with grand multiparity, unspecified trimester: Secondary | ICD-10-CM

## 2024-03-21 DIAGNOSIS — B009 Herpesviral infection, unspecified: Secondary | ICD-10-CM

## 2024-03-21 DIAGNOSIS — E663 Overweight: Secondary | ICD-10-CM

## 2024-03-21 DIAGNOSIS — O0943 Supervision of pregnancy with grand multiparity, third trimester: Secondary | ICD-10-CM

## 2024-03-21 DIAGNOSIS — O24419 Gestational diabetes mellitus in pregnancy, unspecified control: Secondary | ICD-10-CM

## 2024-03-21 DIAGNOSIS — Z3A40 40 weeks gestation of pregnancy: Secondary | ICD-10-CM

## 2024-03-21 LAB — URINALYSIS
Bilirubin, UA: NEGATIVE
Glucose, UA: NEGATIVE
Ketones, UA: NEGATIVE
Leukocytes,UA: NEGATIVE
Nitrite, UA: NEGATIVE
Protein,UA: NEGATIVE
RBC, UA: NEGATIVE
Specific Gravity, UA: 1.015 (ref 1.005–1.030)
Urobilinogen, Ur: 0.2 mg/dL (ref 0.2–1.0)
pH, UA: 5.5 (ref 5.0–7.5)

## 2024-03-21 NOTE — Progress Notes (Signed)
 [facility] [address], [address] [place] [address] Main phone: [phone]  Prenatal Visit  Subjective:  Kristine Blake is a 33 y.o. H2E4984 at [redacted]w[redacted]d being seen today for ongoing prenatal care.  She is currently monitored for the following issues for this high-risk pregnancy:   Patient Active Problem List   Diagnosis Date Noted   Gestational diabetes mellitus (GDM) in third trimester 01/24/2024   Late prenatal care at approx 22 weeks 11/22/2023   Encounter for supervision of high risk pregnancy with grand multiparity, antepartum 11/22/2023   Overweight- pregravid BMI 28.55 11/22/2023   Stress 11/22/2023   History of postpartum hemorrhage  (2019) 03/06/2019   Herpes 03/03/2019   Patient reports no complaints.  Contractions: Irregular. Vag. Bleeding: None.  Movement: Present. Denies leaking of fluid/ROM.   The following portions of the patient's history were reviewed and updated as appropriate: allergies, current medications, past family history, past medical history, past social history, past surgical history and problem list. Problem list updated.  Objective:   Vitals:   03/21/24 1359  BP: 109/74  Pulse: 93  Temp: 97.9 F (36.6 C)  Weight: 167 lb 9.6 oz (76 kg)   Fetal Status: Fetal Heart Rate (bpm): 146 Fundal Height: 38 cm Movement: Present     General:  Alert, oriented and cooperative. Patient is in no acute distress.  Skin: Skin is warm and dry. No rash noted.   Cardiovascular: Normal heart rate noted  Respiratory: Normal respiratory effort, no problems with respiration noted  Abdomen: Soft, gravid, appropriate for gestational age.  Pain/Pressure: Absent     Pelvic: Cervical exam performed        Extremities: Normal range of motion.     Mental Status: Normal mood and affect. Normal behavior. Normal judgment and thought content.   Assessment and Plan:  Pregnancy: H2E4984 at [redacted]w[redacted]d  [redacted]  weeks gestation of pregnancy  Encounter for supervision of high risk pregnancy with grand multiparity, antepartum Assessment & Plan: Doing well. BP normal at 109/74. TWG 16 lb 9.6 oz (7.53 kg). Taking prenatal vitamin daily. Membrane sweep requested and performed without complication. IOL scheduled for Mon 7/8 at 0500. Next appointment should be 6 week postpartum.   Discussed or disclosed in AVS: - Breast feeding support through [facility] breast feeding peer counselor program - Mood resources, including 988, Maternal Mental Health Line, and counselor Alan Hail, LCSW, here at [facility] - Prenatal classes - Pain control during delivery: epidural  - Support person during delivery: partner - Anticipated contraception: hormonal implant - Pre-eclampsia warning signs: Signs and symptoms of preeclampsia were verbally reviewed with warning signs, when and how to call. A written handout with tips on how to take your blood pressure, as well as warning signs was provided to the patient.    Herpes Assessment & Plan: Taking valacyclovir suppression BID. Patient denies any recent outbreaks. Pelvic exam without any stigmata of HSV.    Gestational diabetes mellitus (GDM) in third trimester, gestational diabetes method of control unspecified Assessment & Plan: UA negative today. Plan for 2 hr GTT at postpartum appointment.   Orders: -     Urinalysis  Overweight- pregravid BMI 28.55  History of postpartum hemorrhage  (2019)  Late prenatal care at approx 22 weeks  Term labor symptoms and general obstetric precautions including but not limited to vaginal bleeding, contractions, leaking of fluid and fetal movement were reviewed in detail with the patient. Please refer to After Visit Summary  for other counseling recommendations.  Return in about 6 weeks (around 05/02/2024) for Post partum visit in 6 to 7 weeks.  No future appointments.  Betsey CHRISTELLA Helling, MD

## 2024-03-21 NOTE — Assessment & Plan Note (Addendum)
 Taking valacyclovir suppression BID. Patient denies any recent outbreaks. Pelvic exam without any stigmata of HSV.

## 2024-03-21 NOTE — Progress Notes (Addendum)
 Blood sugar log copied and in outguide for provider review. Client only brought readings for 03/18/24 - 03/21/24 and states left the other paper at home. Urine dip today. Aware of IOL appt 03/24/24 with arrival time of 0500 to ED. Burnadette Lowers, RN Urine dip reviewed by Dr. Macario. Burnadette Lowers, RN

## 2024-03-21 NOTE — Patient Instructions (Signed)
 I translated the following text using Google translate.  Please excuse any errors.  He traducido el siguiente texto con el traductor de Microbiologist. Disculpe cualquier error.  Pregnancy Continue taking your prenatal vitamin daily.  Please visit WIC (women's, infants, and children program) to see about their breast feeding peer support program. You can start this program to get tips and tricks for successful breast feeding of your baby.  Embarazo Contine tomando sus vitamina prenatal diaria. Visita WIC (programa para mujeres, bebs y nios) para Solicitor su programa de apoyo entre pares para la Tour manager. Puedes iniciar este programa para obtener consejos y trucos para una lactancia exitosa de tu beb.  Prenatal Classes If delivering at [facility] with [facility] or [facility]: Go to [email or web address]   If delivering at [facility]: Go to [email or web address] and search for Pregnancy and Parenting Classes Prepared Childbirth Classes Clases prenatales Si el parto se realizar en [facility] con el OB de la [place] o en [facility]: Visite [email or web address]  Si el parto se realizar en la [facility]: visite [email or web address]/ y busque: Clases de embarazo y crianza Clases de preparacin para Education administrator regarding exposures that could affect pregnancy: [facility] is a Dentist with lots of information on the effects of many medications and exposures on your pregnancy. It is free to use, including their web site, phone line, text service, app, or email and live chat.  [email or web address]/ Email or live chat: MotherToBaby.org Phone: [phone] Text: [phone] App: search LactRx  Recursos sobre exposiciones que podran afectar el embarazo: Mother To Baby es una organizacin sin fines de lucro con  mucha informacin sobre los efectos de muchos medicamentos y exposiciones en el embarazo. Su uso es gratuito, incluido su sitio web, lnea telefnica, servicio de mensajes de texto, aplicacin o correo electrnico y chat en vivo. CDApps.pl Correo electrnico o chat en vivo: MotherToBaby.org Telfono: [phone] Mensaje de texto: [phone] Aplicacin: busque LactRx  Maternal Mental Health If you start to develop the below symptoms of depression, please reach out to us  for an appointment. There is also a Biomedical scientist Health Hotline at [phone]). This hotline has trained counselors, doulas, and midwifes to real-time support, information, and resources.  Feeling sad or hopeless most of the time Lack of interest in things you used to enjoy Less interest in caring for yourself (dressing, fixing hair) Trouble concentrating Trouble coping with daily tasks Constant worry about your baby Sleeping or eating too much or too little Feeling very anxious or nervous Unexplained irritability or anger Unwanted or scary thoughts Feeling that you are not a good mother Thoughts of hurting yourself or your baby If you feel you are experiencing a mental health crisis, please reach out to the National Suicide Prevention Hotline at 1-800-273-TALK 773-825-3271).  Salud Mental Materna Si comienza a Environmental education officer los siguientes sntomas de depresin, comunquese con nosotros para programar una cita. Tambin hay una lnea directa nacional de salud mental materna en el 1-833-9-HELP4MOMS [phone]). Esta lnea directa ha capacitado a consejeros, doulas y parteras para brindar apoyo, informacin y recursos en tiempo real.  Jackey triste o sin esperanza la mayor parte del tiempo  Falta de inters en las cosas que sola disfrutar  Menos inters en cuidarse a s mismo (vestirse, arreglarse el cabello)  Problemas para concentrarse   Problemas para hacer frente a las tareas diarias  Preocupacin constante por su beb  Dormir o Microbiologist o muy poco  Sentirse  muy ansioso o nervioso  Irritabilidad o ira inexplicables  Pensamientos no deseados o atemorizantes  Sentir que no eres una buena madre  Pensamientos de Runner, broadcasting/film/video a s Visual merchandiser o a su beb Si cree que est experimentando una crisis de salud mental, comunquese con la [employer] de Prevencin del Suicidio al 1-800-273-TALK [phone]).

## 2024-03-21 NOTE — Assessment & Plan Note (Addendum)
 Doing well. BP normal at 109/74. TWG 16 lb 9.6 oz (7.53 kg). Taking prenatal vitamin daily. Membrane sweep requested and performed without complication. IOL scheduled for Mon 7/8 at 0500. Next appointment should be 6 week postpartum.   Discussed or disclosed in AVS: - Breast feeding support through [facility] breast feeding peer counselor program - Mood resources, including 988, Maternal Mental Health Line, and counselor Alan Hail, LCSW, here at [facility] - Prenatal classes - Pain control during delivery: epidural  - Support person during delivery: partner - Anticipated contraception: hormonal implant - Pre-eclampsia warning signs: Signs and symptoms of preeclampsia were verbally reviewed with warning signs, when and how to call. A written handout with tips on how to take your blood pressure, as well as warning signs was provided to the patient.

## 2024-03-21 NOTE — Assessment & Plan Note (Signed)
 UA negative today. Plan for 2 hr GTT at postpartum appointment.

## 2024-03-22 ENCOUNTER — Ambulatory Visit: Payer: Self-pay

## 2024-03-24 ENCOUNTER — Other Ambulatory Visit: Payer: Self-pay

## 2024-03-24 ENCOUNTER — Inpatient Hospital Stay
Admission: EM | Admit: 2024-03-24 | Discharge: 2024-03-26 | DRG: 768 | Disposition: A | Discharge status: Home / Self Care | Payer: MEDICAID | Attending: Certified Nurse Midwife | Admitting: Certified Nurse Midwife

## 2024-03-24 ENCOUNTER — Encounter: Payer: Self-pay | Admitting: Obstetrics

## 2024-03-24 DIAGNOSIS — Z3A40 40 weeks gestation of pregnancy: Secondary | ICD-10-CM

## 2024-03-24 DIAGNOSIS — O9903 Anemia complicating the puerperium: Secondary | ICD-10-CM

## 2024-03-24 DIAGNOSIS — E663 Overweight: Secondary | ICD-10-CM

## 2024-03-24 DIAGNOSIS — O480 Post-term pregnancy: Secondary | ICD-10-CM

## 2024-03-24 DIAGNOSIS — O09293 Supervision of pregnancy with other poor reproductive or obstetric history, third trimester: Secondary | ICD-10-CM

## 2024-03-24 DIAGNOSIS — O24420 Gestational diabetes mellitus in childbirth, diet controlled: Principal | ICD-10-CM | POA: Diagnosis present

## 2024-03-24 DIAGNOSIS — Z833 Family history of diabetes mellitus: Secondary | ICD-10-CM

## 2024-03-24 DIAGNOSIS — O9832 Other infections with a predominantly sexual mode of transmission complicating childbirth: Secondary | ICD-10-CM | POA: Diagnosis present

## 2024-03-24 DIAGNOSIS — D62 Acute posthemorrhagic anemia: Secondary | ICD-10-CM

## 2024-03-24 DIAGNOSIS — O0940 Supervision of pregnancy with grand multiparity, unspecified trimester: Principal | ICD-10-CM

## 2024-03-24 DIAGNOSIS — A6000 Herpesviral infection of urogenital system, unspecified: Secondary | ICD-10-CM | POA: Diagnosis present

## 2024-03-24 DIAGNOSIS — O24429 Gestational diabetes mellitus in childbirth, unspecified control: Secondary | ICD-10-CM

## 2024-03-24 LAB — CBC WITH DIFFERENTIAL/PLATELET
Abs Immature Granulocytes: 0.06 K/uL (ref 0.00–0.07)
Basophils Absolute: 0 K/uL (ref 0.0–0.1)
Basophils Relative: 0 %
Eosinophils Absolute: 0 K/uL (ref 0.0–0.5)
Eosinophils Relative: 0 %
HCT: 36.9 % (ref 36.0–46.0)
Hemoglobin: 12.3 g/dL (ref 12.0–15.0)
Immature Granulocytes: 1 %
Lymphocytes Relative: 36 %
Lymphs Abs: 3.2 K/uL (ref 0.7–4.0)
MCH: 27.5 pg (ref 26.0–34.0)
MCHC: 33.3 g/dL (ref 30.0–36.0)
MCV: 82.4 fL (ref 80.0–100.0)
Monocytes Absolute: 0.6 K/uL (ref 0.1–1.0)
Monocytes Relative: 7 %
Neutro Abs: 5 K/uL (ref 1.7–7.7)
Neutrophils Relative %: 56 %
Platelets: 253 K/uL (ref 150–400)
RBC: 4.48 MIL/uL (ref 3.87–5.11)
RDW: 14.8 % (ref 11.5–15.5)
WBC: 8.9 K/uL (ref 4.0–10.5)
nRBC: 0 % (ref 0.0–0.2)

## 2024-03-24 LAB — CBC
HCT: 36.7 % (ref 36.0–46.0)
Hemoglobin: 12.5 g/dL (ref 12.0–15.0)
MCH: 28 pg (ref 26.0–34.0)
MCHC: 34.1 g/dL (ref 30.0–36.0)
MCV: 82.1 fL (ref 80.0–100.0)
Platelets: 230 K/uL (ref 150–400)
RBC: 4.47 MIL/uL (ref 3.87–5.11)
RDW: 14.8 % (ref 11.5–15.5)
WBC: 8.1 K/uL (ref 4.0–10.5)
nRBC: 0 % (ref 0.0–0.2)

## 2024-03-24 LAB — RPR: RPR Ser Ql: NONREACTIVE

## 2024-03-24 LAB — GLUCOSE, CAPILLARY
Glucose-Capillary: 115 mg/dL — ABNORMAL HIGH (ref 70–99)
Glucose-Capillary: 106 mg/dL — ABNORMAL HIGH (ref 70–99)
Glucose-Capillary: 97 mg/dL (ref 70–99)
Glucose-Capillary: 142 mg/dL — ABNORMAL HIGH (ref 70–99)

## 2024-03-24 MED ORDER — TRANEXAMIC ACID-NACL 1000-0.7 MG/100ML-% IV SOLN: 1000.0000 mg | Freq: Once | INTRAVENOUS | Status: AC

## 2024-03-24 MED ORDER — OXYTOCIN 10 UNIT/ML IJ SOLN
INTRAMUSCULAR | Status: AC
  Filled 2024-03-24: qty 2

## 2024-03-24 MED ORDER — LACTATED RINGERS IV SOLN
INTRAVENOUS | Status: DC
  Administered 2024-03-24: 125 mL via INTRAVENOUS

## 2024-03-24 MED ORDER — AMMONIA AROMATIC IN INHA
RESPIRATORY_TRACT | Status: AC
  Filled 2024-03-24: qty 10

## 2024-03-24 MED ORDER — SODIUM CHLORIDE 0.9% IV SOLUTION: Freq: Once | INTRAVENOUS | Status: AC

## 2024-03-24 MED ORDER — DIBUCAINE (PERIANAL) 1 % EX OINT: 1.0000 | TOPICAL_OINTMENT | CUTANEOUS | Status: DC | PRN

## 2024-03-24 MED ORDER — HYDROXYZINE HCL 25 MG PO TABS: 50.0000 mg | ORAL_TABLET | Freq: Four times a day (QID) | ORAL | Status: DC | PRN

## 2024-03-24 MED ORDER — METHYLERGONOVINE MALEATE 0.2 MG/ML IJ SOLN
INTRAMUSCULAR | Status: AC
  Administered 2024-03-24: 0.2 mg
  Filled 2024-03-24: qty 1

## 2024-03-24 MED ORDER — IBUPROFEN 600 MG PO TABS
600.0000 mg | ORAL_TABLET | Freq: Four times a day (QID) | ORAL | Status: DC
  Administered 2024-03-24 – 2024-03-26 (×7): 600 mg via ORAL
  Filled 2024-03-24 (×6): qty 1

## 2024-03-24 MED ORDER — MISOPROSTOL 200 MCG PO TABS: 800.0000 ug | ORAL_TABLET | Freq: Once | ORAL | Status: DC

## 2024-03-24 MED ORDER — LIDOCAINE HCL (PF) 1 % IJ SOLN
30.0000 mL | INTRAMUSCULAR | Status: DC | PRN
  Filled 2024-03-24: qty 30

## 2024-03-24 MED ORDER — TERBUTALINE SULFATE 1 MG/ML IJ SOLN: 0.2500 mg | Freq: Once | INTRAMUSCULAR | Status: DC | PRN

## 2024-03-24 MED ORDER — ACETAMINOPHEN 500 MG PO TABS
ORAL_TABLET | ORAL | Status: AC
Start: 2024-03-24 — End: 2024-03-25
  Filled 2024-03-24: qty 2

## 2024-03-24 MED ORDER — METHYLERGONOVINE MALEATE 0.2 MG/ML IJ SOLN: 0.2000 mg | Freq: Once | INTRAMUSCULAR | Status: AC

## 2024-03-24 MED ORDER — METHYLERGONOVINE MALEATE 0.2 MG PO TABS
0.2000 mg | ORAL_TABLET | Freq: Four times a day (QID) | ORAL | Status: AC
  Administered 2024-03-25 (×3): 0.2 mg via ORAL
  Filled 2024-03-24 (×3): qty 1

## 2024-03-24 MED ORDER — OXYCODONE HCL 5 MG PO TABS
5.0000 mg | ORAL_TABLET | ORAL | Status: DC | PRN
  Administered 2024-03-24: 5 mg via ORAL
  Filled 2024-03-24: qty 1

## 2024-03-24 MED ORDER — OXYTOCIN BOLUS FROM INFUSION
333.0000 mL | Freq: Once | INTRAVENOUS | Status: AC
  Administered 2024-03-24: 333 mL via INTRAVENOUS

## 2024-03-24 MED ORDER — CEFAZOLIN SODIUM-DEXTROSE 2-4 GM/100ML-% IV SOLN
2.0000 g | Freq: Once | INTRAVENOUS | Status: AC
  Administered 2024-03-24: 2 g via INTRAVENOUS
  Filled 2024-03-24: qty 100

## 2024-03-24 MED ORDER — WITCH HAZEL-GLYCERIN EX PADS
MEDICATED_PAD | CUTANEOUS | Status: AC
  Filled 2024-03-24: qty 100

## 2024-03-24 MED ORDER — OXYTOCIN-SODIUM CHLORIDE 30-0.9 UT/500ML-% IV SOLN
1.0000 m[IU]/min | INTRAVENOUS | Status: DC
  Administered 2024-03-24: 2 m[IU]/min via INTRAVENOUS

## 2024-03-24 MED ORDER — LACTATED RINGERS IV SOLN: 500.0000 mL | INTRAVENOUS | Status: DC | PRN

## 2024-03-24 MED ORDER — WITCH HAZEL-GLYCERIN EX PADS
1.0000 | MEDICATED_PAD | CUTANEOUS | Status: DC | PRN
  Administered 2024-03-24: 1 via TOPICAL

## 2024-03-24 MED ORDER — MISOPROSTOL 50MCG HALF TABLET: 50.0000 ug | ORAL_TABLET | ORAL | Status: DC | PRN

## 2024-03-24 MED ORDER — MISOPROSTOL 200 MCG PO TABS
ORAL_TABLET | ORAL | Status: AC
  Administered 2024-03-24: 800 ug via RECTAL
  Filled 2024-03-24: qty 4

## 2024-03-24 MED ORDER — OXYTOCIN-SODIUM CHLORIDE 30-0.9 UT/500ML-% IV SOLN
2.5000 [IU]/h | INTRAVENOUS | Status: DC
  Filled 2024-03-24: qty 500

## 2024-03-24 MED ORDER — FENTANYL CITRATE (PF) 100 MCG/2ML IJ SOLN
50.0000 ug | INTRAMUSCULAR | Status: DC | PRN
  Administered 2024-03-24 (×2): 100 ug via INTRAVENOUS
  Filled 2024-03-24 (×2): qty 2

## 2024-03-24 MED ORDER — IBUPROFEN 600 MG PO TABS
ORAL_TABLET | ORAL | Status: AC
  Filled 2024-03-24: qty 1

## 2024-03-24 MED ORDER — METHYLERGONOVINE MALEATE 0.2 MG/ML IJ SOLN
INTRAMUSCULAR | Status: AC
  Administered 2024-03-24: 0.2 mg via INTRAMUSCULAR
  Filled 2024-03-24: qty 1

## 2024-03-24 MED ORDER — LACTATED RINGERS IV BOLUS
1000.0000 mL | Freq: Once | INTRAVENOUS | Status: AC
  Administered 2024-03-24: 1000 mL via INTRAVENOUS

## 2024-03-24 MED ORDER — ACETAMINOPHEN 325 MG PO TABS: 650.0000 mg | ORAL_TABLET | ORAL | Status: DC | PRN

## 2024-03-24 MED ORDER — TRANEXAMIC ACID-NACL 1000-0.7 MG/100ML-% IV SOLN
INTRAVENOUS | Status: AC
  Administered 2024-03-24: 1000 mg via INTRAVENOUS
  Filled 2024-03-24: qty 100

## 2024-03-24 MED ORDER — SOD CITRATE-CITRIC ACID 500-334 MG/5ML PO SOLN: 30.0000 mL | ORAL | Status: DC | PRN

## 2024-03-24 MED ORDER — ACETAMINOPHEN 500 MG PO TABS
1000.0000 mg | ORAL_TABLET | Freq: Four times a day (QID) | ORAL | Status: DC
  Administered 2024-03-24 – 2024-03-25 (×2): 1000 mg via ORAL
  Filled 2024-03-24: qty 2

## 2024-03-24 MED ORDER — ONDANSETRON HCL 4 MG/2ML IJ SOLN
4.0000 mg | Freq: Four times a day (QID) | INTRAMUSCULAR | Status: DC | PRN
Start: 2024-03-24 — End: 2024-03-25

## 2024-03-24 NOTE — Progress Notes (Addendum)
 BP 115/80   Pulse (!) 106   Temp 98.8 F (37.1 C) (Axillary)   Resp 15   Ht 5' 1 (1.549 m)   Wt 75.3 kg   LMP 06/15/2023 (Exact Date)   SpO2 98%   Breastfeeding Unknown   BMI 31.37 kg/m   CTSP @2050  due to rising fundus, on exam F@U +1, initially boggy, firm with massage. Fentanyl  100mg  IV, then SVE with clot extracted, QBL. Methergine  0.2mg  IM administered. Total QBL . Dr. Janit updated on condition.  Orders placed to prepare 2u PRBCs, CBC ordered for midnight, patient aware of possible transfusion and consented. @2145  pt reported feeling faint & hot, fundus remains firm, bleeding scant. Continue to closely monitor. Harlene LITTIE Cisco, CNM

## 2024-03-24 NOTE — Discharge Summary (Signed)
 Postpartum Discharge Summary  Date of Service updated***     Patient Name: Kristine Blake DOB: Jan 17, 1991 MRN: [id number]  Date of admission: 03/24/2024 Delivery date:03/24/2024 Delivering provider: JAYNE HARLENE CROME Date of discharge: 03/24/2024  Admitting diagnosis: Labor and delivery, indication for care [O75.9] Intrauterine pregnancy: [redacted]w[redacted]d     Secondary diagnosis:  Active Problems:   Labor and delivery, indication for care  Additional problems: ***    Discharge diagnosis: Term Pregnancy Delivered and PPH                                              Post partum procedures:[name] placement Augmentation: AROM and Pitocin  Complications: Hemorrhage>1066mL  Hospital course: Induction of Labor With Vaginal Delivery   33 y.o. yo H2E3983 at [redacted]w[redacted]d was admitted to the hospital 03/24/2024 for induction of labor.  Indication for induction: A1 DM.  Patient had an labor course complicated bynone Membrane Rupture Time/Date: 3:48 PM,03/24/2024  Delivery Method:Vaginal, Spontaneous Operative Delivery:N/A Episiotomy: None Lacerations:  None Details of delivery can be found in separate delivery note.  Patient had a postpartum course complicated by***. Patient is discharged home 03/24/24.  Newborn Data: Birth date:03/24/2024 Birth time:4:51 PM Gender:Female Living status:Living Apgars:8 ,9  Weight:   Magnesium Sulfate received: No BMZ received: No Rhophylac:N/A MMR:N/A T-DaP:Given prenatally Flu: N/A RSV Vaccine received: No Transfusion:{Transfusion received:30440034} Immunizations administered: Immunization History  Administered Date(s) Administered   Influenza, Seasonal, Injecte, Preservative Fre 11/22/2023   Tdap 04/17/2022, 01/07/2024    Physical exam  Vitals:   03/24/24 1724 03/24/24 1725 03/24/24 1730 03/24/24 1735  BP: (!) 114/90     Pulse: (!) 111     Resp:      Temp:      TempSrc:      SpO2:  99% 99% 97%  Weight:      Height:       General: {Exam;  general:21111117} Lochia: {Desc; appropriate/inappropriate:30686::appropriate} Uterine Fundus: {Desc; firm/soft:30687} Incision: {Exam; incision:21111123} DVT Evaluation: {Exam; dvt:2111122} Labs: Lab Results  Component Value Date   WBC 8.1 03/24/2024   HGB 12.5 03/24/2024   HCT 36.7 03/24/2024   MCV 82.1 03/24/2024   PLT 230 03/24/2024      Latest Ref Rng & Units 10/20/2016    4:31 AM  CMP  Glucose 65 - 99 mg/dL 96   BUN 6 - 20 mg/dL 9   Creatinine 9.55 - 8.99 mg/dL 9.46   Sodium 864 - 854 mmol/L 139   Potassium 3.5 - 5.1 mmol/L 3.8   Chloride 101 - 111 mmol/L 108   CO2 22 - 32 mmol/L 25   Calcium 8.9 - 10.3 mg/dL 9.0    Edinburgh Score:    01/07/2024    4:57 PM  Edinburgh Postnatal Depression Scale Screening Tool  I have been able to laugh and see the funny side of things. 0  I have looked forward with enjoyment to things. 0  I have blamed myself unnecessarily when things went wrong. 1  I have been anxious or worried for no good reason. 1  I have felt scared or panicky for no good reason. 0  Things have been getting on top of me. 1  I have been so unhappy that I have had difficulty sleeping. 1  I have felt sad or miserable. 1  I have been so unhappy that I have been crying.  1  The thought of harming myself has occurred to me. 0  Edinburgh Postnatal Depression Scale Total 6      Data saved with a previous flowsheet row definition      After visit meds:  Allergies as of 03/24/2024   No Known Allergies   Med Rec must be completed prior to using this [facility]***        Discharge home in stable condition Infant Feeding: Bottle and Breast Infant Disposition:{CHL IP OB HOME WITH FNUYZM:76418} Discharge instruction: per After Visit Summary and Postpartum booklet. Activity: Advance as tolerated. Pelvic rest for 6 weeks.  Diet: carb modified diet Anticipated Birth Control: {Birth Control:23956} Postpartum Appointment:{Outpatient follow up:23559} Additional  Postpartum F/U: {PP Procedure:23957} Future Appointments:No future appointments. Follow up Visit:      03/24/2024 Harlene LITTIE Cisco, CNM

## 2024-03-24 NOTE — Progress Notes (Signed)
 BP 104/73   Pulse (!) 113   Temp 99.7 F (37.6 C) (Oral)   Resp 15   Ht 5' 1 (1.549 m)   Wt 75.3 kg   LMP 06/15/2023 (Exact Date)   SpO2 98%   Breastfeeding Unknown   BMI 31.37 kg/m   [name] removed intact. Foley removed. Fundal massage after [name] removed with weighed out. QBL now 1525.  Repeat CBC ordered for midnight. Discussed with patient possibility of blood transfusion dependent on results & symptoms. Harlene LITTIE Cisco, CNM 8:02 PM
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
## 2024-03-24 NOTE — Progress Notes (Signed)
 Labor Progress Note   ASSESSMENT/PLAN   Mirabel Ahlgren 33 y.o.   H2E4984  at [redacted]w[redacted]d here for IOL due to GDM.  FWB:  - Fetal well being assessed: Category I GBS: - GBS negative  LABOR: - Now in early active labor, doing well. - Pain Management: labor support without medications - Discussed options with patient and AROM performed for small amount clear fluid. Continue pitocin  titration. - Anticipate SVD   Active Problems:   Labor and delivery, indication for care   SUBJECTIVE/OBJECTIVE   SUBJECTIVE: Breathing through contractions, coping well.    OBJECTIVE: Vital Signs: Patient Vitals for the past 12 hrs:  BP Temp Temp src Pulse Resp Height Weight  03/24/24 1235 107/67 98.2 F (36.8 C) Oral 77 -- -- --  03/24/24 0750 119/83 98.3 F (36.8 C) Oral 72 15 5' 1 (1.549 m) 75.3 kg    Last SVE:  Dilation: 5 Effacement (%): 80 Cervical Position: Posterior Station: -2 Presentation: Vertex Exam by:: Jayne CNM -  , Rupture Date: 03/24/24, Rupture Time: 1548,    FHR:   - Mode: External  - Baseline Rate (A): 145 bpm (fht)  -  Moderate variability  - Characteristics (ie - accels, decels): Accelerations: 15 x 15  -  Decels absent  UTERINE ACTIVITY:   - Mode: Toco  - Contraction Frequency (min): 3-4 minutes

## 2024-03-24 NOTE — Progress Notes (Signed)
 120cc fluid removed from [name]. Suction d/c'd. Pt tolerated well.125cc blood in canister

## 2024-03-25 LAB — CBC
HCT: 23.3 % — ABNORMAL LOW (ref 36.0–46.0)
HCT: 26.6 % — ABNORMAL LOW (ref 36.0–46.0)
HCT: 27.1 % — ABNORMAL LOW (ref 36.0–46.0)
Hemoglobin: 8 g/dL — ABNORMAL LOW (ref 12.0–15.0)
Hemoglobin: 9.2 g/dL — ABNORMAL LOW (ref 12.0–15.0)
Hemoglobin: 9.2 g/dL — ABNORMAL LOW (ref 12.0–15.0)
MCH: 28 pg (ref 26.0–34.0)
MCH: 28.5 pg (ref 26.0–34.0)
MCH: 28.8 pg (ref 26.0–34.0)
MCHC: 33.9 g/dL (ref 30.0–36.0)
MCHC: 34.3 g/dL (ref 30.0–36.0)
MCHC: 34.6 g/dL (ref 30.0–36.0)
MCV: 82.6 fL (ref 80.0–100.0)
MCV: 82.9 fL (ref 80.0–100.0)
MCV: 83.4 fL (ref 80.0–100.0)
Platelets: 169 K/uL (ref 150–400)
Platelets: 176 K/uL (ref 150–400)
Platelets: 216 K/uL (ref 150–400)
RBC: 2.81 MIL/uL — ABNORMAL LOW (ref 3.87–5.11)
RBC: 3.19 MIL/uL — ABNORMAL LOW (ref 3.87–5.11)
RBC: 3.28 MIL/uL — ABNORMAL LOW (ref 3.87–5.11)
RDW: 14.6 % (ref 11.5–15.5)
RDW: 14.7 % (ref 11.5–15.5)
RDW: 14.7 % (ref 11.5–15.5)
WBC: 11.2 K/uL — ABNORMAL HIGH (ref 4.0–10.5)
WBC: 12.3 K/uL — ABNORMAL HIGH (ref 4.0–10.5)
WBC: 18.6 K/uL — ABNORMAL HIGH (ref 4.0–10.5)
nRBC: 0 % (ref 0.0–0.2)
nRBC: 0 % (ref 0.0–0.2)
nRBC: 0 % (ref 0.0–0.2)

## 2024-03-25 LAB — PREPARE RBC (CROSSMATCH)

## 2024-03-25 LAB — GLUCOSE, CAPILLARY
Glucose-Capillary: 86 mg/dL (ref 70–99)
Glucose-Capillary: 88 mg/dL (ref 70–99)

## 2024-03-25 MED ORDER — FERROUS SULFATE 325 (65 FE) MG PO TABS
325.0000 mg | ORAL_TABLET | Freq: Every day | ORAL | Status: DC
  Administered 2024-03-25 – 2024-03-26 (×2): 325 mg via ORAL
  Filled 2024-03-25 (×2): qty 1

## 2024-03-25 MED ORDER — ACETAMINOPHEN 500 MG PO TABS
1000.0000 mg | ORAL_TABLET | Freq: Four times a day (QID) | ORAL | Status: DC
  Administered 2024-03-25 – 2024-03-26 (×5): 1000 mg via ORAL
  Filled 2024-03-25 (×5): qty 2

## 2024-03-25 MED ORDER — ZOLPIDEM TARTRATE 5 MG PO TABS: 5.0000 mg | ORAL_TABLET | Freq: Every evening | ORAL | Status: DC | PRN

## 2024-03-25 MED ORDER — PRENATAL MULTIVITAMIN CH
1.0000 | ORAL_TABLET | Freq: Every day | ORAL | Status: DC
  Administered 2024-03-25 – 2024-03-26 (×2): 1 via ORAL
  Filled 2024-03-25 (×2): qty 1

## 2024-03-25 MED ORDER — ONDANSETRON HCL 4 MG PO TABS: 4.0000 mg | ORAL_TABLET | ORAL | Status: DC | PRN

## 2024-03-25 MED ORDER — SODIUM CHLORIDE 0.9% IV SOLUTION: Freq: Once | INTRAVENOUS | Status: AC

## 2024-03-25 MED ORDER — DOCUSATE SODIUM 100 MG PO CAPS
100.0000 mg | ORAL_CAPSULE | Freq: Two times a day (BID) | ORAL | Status: DC
  Administered 2024-03-25 – 2024-03-26 (×3): 100 mg via ORAL
  Filled 2024-03-25 (×3): qty 1

## 2024-03-25 MED ORDER — ONDANSETRON HCL 4 MG/2ML IJ SOLN: 4.0000 mg | INTRAMUSCULAR | Status: DC | PRN

## 2024-03-25 MED ORDER — COCONUT OIL OIL: 1.0000 | TOPICAL_OIL | Status: DC | PRN

## 2024-03-25 MED ORDER — DIPHENHYDRAMINE HCL 25 MG PO CAPS: 25.0000 mg | ORAL_CAPSULE | Freq: Four times a day (QID) | ORAL | Status: DC | PRN

## 2024-03-25 MED ORDER — SIMETHICONE 80 MG PO CHEW: 80.0000 mg | CHEWABLE_TABLET | ORAL | Status: DC | PRN

## 2024-03-25 MED ORDER — BENZOCAINE-MENTHOL 20-0.5 % EX AERO: 1.0000 | INHALATION_SPRAY | CUTANEOUS | Status: DC | PRN

## 2024-03-25 NOTE — Progress Notes (Signed)
 BP (!) 94/58 (BP Location: Right Arm)   Pulse 96   Temp 98.2 F (36.8 C) (Oral)   Resp 18   Ht 5' 1 (1.549 m)   Wt 75.3 kg   LMP 06/15/2023 (Exact Date)   SpO2 98%   Breastfeeding Unknown   BMI 31.37 kg/m      Latest Ref Rng & Units 03/25/2024    5:24 AM 03/25/2024   12:21 AM 03/24/2024    5:14 PM  CBC  WBC 4.0 - 10.5 K/uL 12.3  18.6  8.9   Hemoglobin 12.0 - 15.0 g/dL 8.0  9.2  87.6   Hematocrit 36.0 - 46.0 % 23.3  27.1  36.9   Platelets 150 - 400 K/uL 176  216  253     Discussed with Isabel blood transfusion given hemoglobin of 8 this morning. Bleeding now scant, denies dizziness at rest. She was dizzy initially on ambulation to the bathroom, reports she feels much better this morning. Reviewed risks/benefits/alternatives to PRBC administration including IV iron infusion. Desires to discuss with her spouse who is at home currently. Will call for RN to inform of decision. Two units prepared in blood bank.  Harlene LITTIE Cisco, CNM

## 2024-03-25 NOTE — Lactation Note (Signed)
 This note was copied from a baby's chart. Lactation Consultation Note  Patient Name: Boy Cheyla Duchemin Unijb'd Date: 03/25/2024 Age:33 hours Reason for consult: Initial assessment;Term   Maternal Data Has patient been taught Hand Expression?: Yes Does the patient have breastfeeding experience prior to this delivery?: Yes How long did the patient breastfeed?: 1 month for all of them  Initial assessment w/ an experienced breastfeeding patient.  Infant is 20hr old.  This was a SVD.  Patient w/ hx of A1GDM and lost > 1000 of blood.  Mom currently receiving a blood .  Feeding goal is breastfeeding and formula feeding.  Mom stated that she has a manual pump at home.  Feeding Mother's Current Feeding Choice: Breast Milk and Formula Nipple Type: Slow - flow  While in room, LC observed infant cueing and offered to assist mom with a feeding at the breast.  Patient expressed that she didn't want to feed at breast because she is concerned that what is being given to her will affect baby.  LC explained that receiving blood will actually help with milk production and not affect infant at all.    Mom attempted a feeding at the breast, infant would open mouth wide but would not suckle or latch.  Mom has very large nipples and infant seems to struggle to get all nipple in his mouth.  Patient stopped attempting for now.   LC also discussed appropriate milk volumes on day 1, no more than 10ml and day 2 15ml of formula.  Patient verbalized understanding.  Interventions Interventions: Breast feeding basics reviewed;Education  LC provided education on the following;  milk production expectations, hunger cues, day 1/2 wet/dirty diapers, hand expression, cluster feeding, benefits of STS and arousing infant for a feeding.  Lactation informed patient of feeding infant at least 8 or more times w/in a 24hr period but not exceeding 3hrs. Patient verbalized understanding.   Discharge Pump:  Personal;Manual WIC Program: Yes  Consult Status Consult Status: Follow-up Follow-up type: In-patient    Alexsys Eskin S Dhruvi Crenshaw 03/25/2024, 1:43 PM

## 2024-03-25 NOTE — Progress Notes (Signed)
 Subjective:   Kristine Blake had a NSVB on 03/24/24. Her labor was uncomplicated. Her birth was complicated by a PPH with total EBL . She had a [name], manual removal of clots, ancef , TXA, misoprostol  PP and methergine . Has received a methergine  series as well with last dose to be given at 1600 today. This morning she was feeling light headed with ambulation, so she was given two units of packed red blood cells. Now reports light lochia and feeling much better.  Pt. Is eating, hydrating, and voiding regularly without difficulty. Has yet to have BM. She is primarily bottle feeding without difficulty. Reports small amount of vaginal bleeding, denies passing large blood clots. Has had cramping abdomen pain relieved with tylenol /ibuprofen , last dose of oxycodone  was yesterday evening.   Objective:  Vital signs in last 24 hours: Temp:  [97.8 F (36.6 C)-99.7 F (37.6 C)] 97.8 F (36.6 C) (07/29 1430) Pulse Rate:  [80-126] 80 (07/29 1430) Resp:  [17-18] 18 (07/29 1430) BP: (93-149)/(58-114) 96/58 (07/29 1430) SpO2:  [95 %-100 %] 100 % (07/29 1430)    General: NAD Pulmonary: no increased work of breathing Breasts: soft, non-tender, nipples without breakdown Abdomen: soft, non-tender Fundus: firm, midline, at umbilicus Lochia: light rubra, no clots Perineum: no erythema or foul odor discharge, minimal edema, laceration well approximated  Extremities: no edema, no erythema, no tenderness  Results for orders placed or performed during the hospital encounter of 03/24/24 (from the past 72 hours)  Glucose, capillary     Status: Abnormal   Collection Time: 03/24/24  5:21 AM  Result Value Ref Range   Glucose-Capillary 115 (H) 70 - 99 mg/dL    Comment: Glucose reference range applies only to samples taken after fasting for at least 8 hours.  CBC     Status: None   Collection Time: 03/24/24  6:06 AM  Result Value Ref Range   WBC 8.1 4.0 - 10.5 K/uL   RBC 4.47 3.87 - 5.11  MIL/uL   Hemoglobin 12.5 12.0 - 15.0 g/dL   HCT 63.2 63.9 - 53.9 %   MCV 82.1 80.0 - 100.0 fL   MCH 28.0 26.0 - 34.0 pg   MCHC 34.1 30.0 - 36.0 g/dL   RDW 85.1 88.4 - 84.4 %   Platelets 230 150 - 400 K/uL   nRBC 0.0 0.0 - 0.2 %    Comment: Performed at [facility], [address] [place], [place] [address]  Type and screen [facility]     Status: None (Preliminary result)   Collection Time: 03/24/24  6:06 AM  Result Value Ref Range   ABO/RH(D) A POS    Antibody Screen NEG    Sample Expiration 03/27/2024,2359    Unit Number [id number]    Blood Component Type RBC LR PHER1    Unit division 00    Status of Unit ISSUED    Transfusion Status OK TO TRANSFUSE    Crossmatch Result      Compatible Performed at [facility], [address], [place], [place] [address]    Unit Number [id number]    Blood Component Type RBC LR PHER2    Unit division 00    Status of Unit ISSUED    Transfusion Status OK TO TRANSFUSE    Crossmatch Result Compatible   RPR     Status: None   Collection Time: 03/24/24  6:06 AM  Result Value Ref Range   RPR Ser Ql NON REACTIVE NON REACTIVE  Comment: Performed at [facility], [address] [place], [place] [address]  Glucose, capillary     Status: Abnormal   Collection Time: 03/24/24 11:09 AM  Result Value Ref Range   Glucose-Capillary 106 (H) 70 - 99 mg/dL    Comment: Glucose reference range applies only to samples taken after fasting for at least 8 hours.  Glucose, capillary     Status: None   Collection Time: 03/24/24  3:46 PM  Result Value Ref Range   Glucose-Capillary 97 70 - 99 mg/dL    Comment: Glucose reference range applies only to samples taken after fasting for at least 8 hours.  CBC with Differential/Platelet     Status: None   Collection Time: 03/24/24  5:14 PM  Result Value Ref Range   WBC 8.9 4.0 - 10.5 K/uL   RBC 4.48 3.87 - 5.11 MIL/uL   Hemoglobin 12.3 12.0 - 15.0 g/dL   HCT  63.0 63.9 - 53.9 %   MCV 82.4 80.0 - 100.0 fL   MCH 27.5 26.0 - 34.0 pg   MCHC 33.3 30.0 - 36.0 g/dL   RDW 85.1 88.4 - 84.4 %   Platelets 253 150 - 400 K/uL   nRBC 0.0 0.0 - 0.2 %   Neutrophils Relative % 56 %   Neutro Abs 5.0 1.7 - 7.7 K/uL   Lymphocytes Relative 36 %   Lymphs Abs 3.2 0.7 - 4.0 K/uL   Monocytes Relative 7 %   Monocytes Absolute 0.6 0.1 - 1.0 K/uL   Eosinophils Relative 0 %   Eosinophils Absolute 0.0 0.0 - 0.5 K/uL   Basophils Relative 0 %   Basophils Absolute 0.0 0.0 - 0.1 K/uL   Immature Granulocytes 1 %   Abs Immature Granulocytes 0.06 0.00 - 0.07 K/uL    Comment: Performed at [facility], [address] [place], [place] [address]  Glucose, capillary     Status: Abnormal   Collection Time: 03/24/24  8:47 PM  Result Value Ref Range   Glucose-Capillary 142 (H) 70 - 99 mg/dL    Comment: Glucose reference range applies only to samples taken after fasting for at least 8 hours.  Prepare RBC (crossmatch)     Status: None   Collection Time: 03/24/24 10:00 PM  Result Value Ref Range   Order Confirmation      ORDER PROCESSED BY BLOOD BANK Performed at [facility], [address] [place], [place] [address]   CBC     Status: Abnormal   Collection Time: 03/25/24 12:21 AM  Result Value Ref Range   WBC 18.6 (H) 4.0 - 10.5 K/uL   RBC 3.28 (L) 3.87 - 5.11 MIL/uL   Hemoglobin 9.2 (L) 12.0 - 15.0 g/dL    Comment: REPEATED TO VERIFY   HCT 27.1 (L) 36.0 - 46.0 %   MCV 82.6 80.0 - 100.0 fL   MCH 28.0 26.0 - 34.0 pg   MCHC 33.9 30.0 - 36.0 g/dL   RDW 85.2 88.4 - 84.4 %   Platelets 216 150 - 400 K/uL   nRBC 0.0 0.0 - 0.2 %    Comment: Performed at [facility], [address] [place], [place] [address]  Glucose, capillary     Status: None   Collection Time: 03/25/24  2:09 AM  Result Value Ref Range   Glucose-Capillary 86 70 - 99 mg/dL    Comment: Glucose reference range applies only to samples taken after fasting for at least 8 hours.  CBC     Status: Abnormal   Collection Time: 03/25/24  5:24 AM  Result Value Ref Range   WBC 12.3 (H) 4.0 - 10.5 K/uL   RBC 2.81 (L) 3.87 - 5.11 MIL/uL   Hemoglobin 8.0 (L) 12.0 - 15.0 g/dL   HCT 76.6 (L) 63.9 - 53.9 %   MCV 82.9 80.0 - 100.0 fL   MCH 28.5 26.0 - 34.0 pg   MCHC 34.3 30.0 - 36.0 g/dL   RDW 85.2 88.4 - 84.4 %   Platelets 176 150 - 400 K/uL   nRBC 0.0 0.0 - 0.2 %    Comment: Performed at [facility], [address] [place], [place] [address]  Glucose, capillary     Status: None   Collection Time: 03/25/24  8:51 AM  Result Value Ref Range   Glucose-Capillary 88 70 - 99 mg/dL    Comment: Glucose reference range applies only to samples taken after fasting for at least 8 hours.    Assessment:   33 y.o. H2E3983 1 day(s)  s/p NSVB Bottlefeeding Anemia secondary to acute blood loss S/p PPH and blood transfusion VSS Pain well controlled  Plan:    PO Fe- has also received 2 units PRBCs, will repeat CBC six hours after second unit was complete Blood Type --/--/A POS (07/28 0606) / Rubella 5.05 (03/27 1046) / Varicella Immune Rhogam not indicated Tdap/varicella/rubella not indicated Feeding plan breast & bottle, lactation support Encouraged to continue breastfeeding, BF education on latch, position changes, cluster feeding, hunger cues, lactogenesis II, milk supply Continued routine postpartum care  Counseled on normal uterine involution and vaginal bleeding postpartum Anticipate discharge home tomorrow    Lolita Loots, FNP, CNM [facility] 03/25/2024, 2:47 PM

## 2024-03-26 ENCOUNTER — Other Ambulatory Visit: Payer: Self-pay

## 2024-03-26 LAB — BPAM RBC
Blood Product Expiration Date: 202508272359
ISSUE DATE / TIME: 202507290912
ISSUE DATE / TIME: 202507291200
ISSUE DATE / TIME: 202508262359
Unit Type and Rh: 202508262359
Unit Type and Rh: 202508272359
Unit Type and Rh: 6200
Unit Type and Rh: 6200

## 2024-03-26 LAB — TYPE AND SCREEN
ABO/RH(D): A POS
Antibody Screen: NEGATIVE
Unit division: 0
Unit division: 0

## 2024-03-26 LAB — GLUCOSE, CAPILLARY
Glucose-Capillary: 67 mg/dL — ABNORMAL LOW (ref 70–99)
Glucose-Capillary: 79 mg/dL (ref 70–99)

## 2024-03-26 MED ORDER — IBUPROFEN 200 MG PO TABS
600.0000 mg | ORAL_TABLET | Freq: Four times a day (QID) | ORAL | 0 refills | Status: AC
  Filled 2024-03-26: qty 100, 9d supply, fill #0

## 2024-03-26 MED ORDER — ACETAMINOPHEN 500 MG PO TABS
1000.0000 mg | ORAL_TABLET | Freq: Four times a day (QID) | ORAL | 0 refills | Status: AC
  Filled 2024-03-26: qty 30, 4d supply, fill #0

## 2024-03-26 NOTE — Lactation Note (Signed)
 This note was copied from a baby's chart. Lactation Consultation Note  Patient Name: Kristine Blake Unijb'd Date: 03/26/2024 Age:33 hours Reason for consult: Follow-up assessment;Term;Other (Comment) (Discharge Education)   Maternal Data Follow up assessment and discharge education w/ [name] and patient.  When asked how is feeding going patient stated that it was bad.  Mom expressed that she attempts breastfeeding but he doesn't do it and instead she gives formula.    Feeding Mother's Current Feeding Choice: Breast Milk and Formula  Lactation Tools Discussed/Used Patient stated that she has a manual pump at home.  LC measured patients nipples so mom could have correct flanges .  LC provided patient w/ a size 30mm flange and 36mm for back up.   Interventions Interventions: Breast feeding basics reviewed;Education;CDC milk storage guidelines  Discharge Education on engorgement prevention/treatment was discussed as well as breastmilk storage guidelines.  LC provided patient with a handout on breastmilk storage guidelines from [facility] outpatient lactation services phone number written on the white board in the room.  Patient verbalized understanding.  LC also provided education from the postpartum book about warning signs to look for in a poor feeding.     Consult Status      [name] RAMAN [name] 03/26/2024, 12:11 PM

## 2024-03-26 NOTE — Progress Notes (Signed)
 Patient discharged. Discharge instructions given. Patient verbalizes understanding. Transported by axillary.

## 2024-04-14 NOTE — Addendum Note (Signed)
 Addended by: Sabryna Lahm on: 04/14/2024 01:26 PM   Modules accepted: Orders

## 2024-05-08 ENCOUNTER — Ambulatory Visit: Payer: Self-pay

## 2024-05-08 VITALS — BP 121/86 | HR 86 | Ht 60.5 in | Wt 154.0 lb

## 2024-05-08 DIAGNOSIS — Z8632 Personal history of gestational diabetes: Secondary | ICD-10-CM

## 2024-05-08 DIAGNOSIS — N393 Stress incontinence (female) (male): Secondary | ICD-10-CM

## 2024-05-08 DIAGNOSIS — Z30011 Encounter for initial prescription of contraceptive pills: Secondary | ICD-10-CM

## 2024-05-08 LAB — HEMOGLOBIN, FINGERSTICK: Hemoglobin: 12.5 g/dL (ref 11.1–15.9)

## 2024-05-08 MED ORDER — NORETHINDRONE 0.35 MG PO TABS: 1.0000 | ORAL_TABLET | Freq: Every day | ORAL | 6 refills | Status: AC

## 2024-05-08 NOTE — Progress Notes (Signed)
 Pt is here for PP visit.  FP card given and reviewed with pt.  The patient was dispensed #6 [email or web address]  today. I provided counseling today regarding the medication. We discussed the medication, the side effects and when to call clinic. Patient given the opportunity to ask questions. Questions answered.  Larraine JONELLE Northern, RN

## 2024-05-08 NOTE — Progress Notes (Signed)
 [facility] [address], [address] [place] [address] Main phone: [phone]  Postpartum Visit  Subjective:  Kristine Blake is a 33 y.o. [id number] female who presents for a postpartum visit. She is 6 weeks postpartum following a normal spontaneous vaginal delivery.  I have fully reviewed the prenatal and intrapartum course. Postpartum course has been good, delivery was so so with some difficulties during her birth.   Patient Active Problem List   Diagnosis Date Noted   Postpartum care following vaginal delivery 03/24/2024   Vaginal delivery 03/24/2024   Postpartum hemorrhage 03/24/2024   Gestational diabetes mellitus (GDM) in third trimester 01/24/2024   Late prenatal care at approx 22 weeks 11/22/2023   Encounter for supervision of high risk pregnancy with grand multiparity, antepartum 11/22/2023   Overweight- pregravid BMI 28.55 11/22/2023   Stress 11/22/2023   History of postpartum hemorrhage  (2019) 03/06/2019   Herpes 03/03/2019   Delivery The delivery was at 40 gestational weeks and 3 days. Anesthesia: none.  Delivery complications: PPH w/ [name] placement Patient describes her labor and delivery as so so.   Infant Baby is doing well.  Baby is feeding mostly by bottle - Similac. Breastfeeding 1-2 times per day. Was breastfeeding some, now mostly bottle feeding formula. No breast concerns today.  GI & GU Bleeding: no bleeding.  Bowel function is normal.  Bladder function: some leaking of urine when exerting such as lifting, exercise. Somewhat of a problem. Has done Kegel exercises in the past.   Sexuality and Contraception Patient is not sexually active.  The pregnancy intention screening data noted above was reviewed. Potential methods of contraception were discussed. The patient elected to proceed with Oral Contraceptive.  She has taken birth control pills in the past and she used an  alarm to remember her pills.  Mood Postpartum depression screening: negative. Flowsheet Row Routine Prenatal from 02/27/2024 in [facility]  PHQ-9 Total Score 3    Health Maintenance Health Maintenance Due  Topic Date Due   Hepatitis B Vaccines 19-59 Average Risk (1 of 3 - 19+ 3-dose series) Never done   HPV VACCINES (1 - 3-dose SCDM series) Never done   Influenza Vaccine  03/28/2024   COVID-19 Vaccine (1 - 2024-25 season) Never done   The following portions of the patient's history were reviewed and updated as appropriate: allergies, current medications, past family history, past medical history, past social history, past surgical history, and problem list.  Review of Systems Pertinent items are noted in HPI.  Objective:  BP 121/86 (BP Location: Left Arm, Patient Position: Sitting, Cuff Size: Normal)   Pulse 86   Ht 5' 0.5 (1.537 m)   Wt 154 lb (69.9 kg)   Breastfeeding No   BMI 29.58 kg/m    General:  alert, cooperative, and appears stated age   Breasts:  normal  Lungs: clear to auscultation bilaterally  Heart:  regular rate and rhythm, S1, S2 normal, no murmur, click, rub or gallop  Abdomen: soft, non-tender; bowel sounds normal; no masses,  no organomegaly   Wound N/a  GU exam:  normal       Assessment:   1. Encounter for initial prescription of contraceptive pills (Primary)  - Patient still breastfeeding ~2 times daily, will start with Micronor  - Discussed she could switch to a combined pill when done breastfeeding - Discussed importance of taking pill daily at precise same time - norethindrone  (MICRONOR ) 0.35 MG tablet; Take  1 tablet (0.35 mg total) by mouth daily for 28 days.; Refill: 6  2. Postpartum care and examination  - Normal PP exam. Breast exam without abnormal findings. Pelvic exam WNL. Patient is fine to resume exercise and sexual activity as tolerated. - PPH >1000 mls, taking PO iron - Hemoglobin, fingerstick today 12.5  3.  History of gestational diabetes  - Needs 2 hour GTT, will schedule nurse visit for a different day  4. Stress incontinence   - Has urine leakage with exertion such as lifting, exercising - Taught Kegel exercises today during bimanual exam - Suggested Kegal exercises 10 squeezes, 3x day - Discussed option of pelvic PT, no insurance however.  - Discussed online resources for pelvic floor strengthening   Normal postpartum exam.   Plan:   Essential components of care per ACOG recommendations:  1.  Mood and well being: Patient with negative depression screening today.  - Patient tobacco use? No.   - hx of drug use? No.    2. Infant care and feeding:  -Patient currently breastmilk feeding? Yes. Reviewed importance of draining breast regularly to support lactation.   3. Sexuality, contraception and birth spacing - Patient does not want a pregnancy in the next year. - Reviewed reproductive life planning. Reviewed options based on patient desire and reproductive life plan. Patient is interested in Oral Contraceptive. This was provided to the patient today.  Risks, benefits, and typical effectiveness rates were reviewed.  Questions were answered.  Written information was also given to the patient to review.    The patient will follow up in  6 months for surveillance (started with six months of Micronor , patient may desire switch to combined pill).  The patient was told to call with any further questions, or with any concerns about this method of contraception.  Emphasized use of condoms 100% of the time for STI prevention.  - Discussed birth spacing of 18 months  4. Sleep and fatigue -Encouraged family/partner/community support of 4 hrs of uninterrupted sleep to help with mood and fatigue  5. Physical Recovery  - Discussed patients delivery and complications. She describes her labor as mixed. - Patient had a Vaginal problems after delivery including PPH. Patient had no laceration.  Patient expressed understanding - Patient has urinary incontinence? Yes. Discussed role of pelvic floor PT. Offered PT and patient declined.  - Patient is safe to resume physical and sexual activity  6.  Health Maintenance - HM due items addressed Yes - Last pap smear: 2023, normal. Due 2028. No results found for: DIAGPAP, HPVHIGH, ADEQPAP Lab Results  Component Value Date   SPECADGYN Comment 12/26/2021   Result Date Procedure Results Follow-ups  12/26/2021 IGP, Aptima HPV DIAGNOSIS:: Comment Specimen adequacy:: Comment Clinician Provided ICD10: Comment Performed by:: Comment PAP Smear Comment: . Note:: Comment Test Methodology: Comment HPV Aptima: Negative   05/01/2019 IGP, rfx Aptima HPV ASCU DIAGNOSIS:: Comment Specimen adequacy:: Comment Clinician Provided ICD10: Comment Performed by:: Comment PAP Smear Comment: . Note:: Comment Test Methodology: Comment PAP Reflex: Comment    - Pap smear not done at today's visit.  -Breast Cancer screening indicated? No.   7. Chronic Disease/Pregnancy Condition follow up: Gestational Diabetes  - 2 hour GTT, patient advised to schedule as soon as able  - PCP follow up  Damien FORBES Satchel, NP [facility] Department

## 2024-05-15 ENCOUNTER — Other Ambulatory Visit: Payer: Self-pay

## 2024-05-15 DIAGNOSIS — Z8632 Personal history of gestational diabetes: Secondary | ICD-10-CM

## 2024-05-15 NOTE — Progress Notes (Signed)
 In clinic for 2hr GTT. Voices no concerns. Patient NPO since 8PM last night. Instructions given to patient. All questions answered and verbalizes understanding; sent to lab for venipuncture.  Interpreter, [place] [phone], helped during this visit.   Doyce CINDERELLA Shuck, RN

## 2024-05-16 LAB — GLUCOSE TOLERANCE, 2 HOURS
Glucose, 2 hour: 115 mg/dL (ref 70–139)
Glucose, GTT - Fasting: 79 mg/dL (ref 70–99)

## 2024-05-19 ENCOUNTER — Ambulatory Visit: Payer: Self-pay | Admitting: Family Medicine

## 2024-05-19 DIAGNOSIS — O24410 Gestational diabetes mellitus in pregnancy, diet controlled: Secondary | ICD-10-CM

## 2024-05-19 NOTE — Assessment & Plan Note (Signed)
 Postpartum 2 hr GTT normal.

## 2024-05-19 NOTE — Progress Notes (Signed)
 Postpartum 2 hr GTT normal.
